# Patient Record
Sex: Male | Born: 1985 | Race: White | Hispanic: No | Smoking: Current some day smoker
Health system: Southern US, Community
[De-identification: ages and names within clinical notes are randomized; demographics above are authoritative.]

## PROBLEM LIST (undated history)

## (undated) DIAGNOSIS — F419 Anxiety disorder, unspecified: Secondary | ICD-10-CM

## (undated) HISTORY — PX: NO PAST SURGERIES: SHX2092

---

## 2020-03-26 ENCOUNTER — Ambulatory Visit (INDEPENDENT_AMBULATORY_CARE_PROVIDER_SITE_OTHER): Payer: 59 | Admitting: Professional

## 2020-03-26 ENCOUNTER — Other Ambulatory Visit: Payer: Self-pay

## 2020-03-26 DIAGNOSIS — F411 Generalized anxiety disorder: Secondary | ICD-10-CM | POA: Insufficient documentation

## 2020-03-26 DIAGNOSIS — F331 Major depressive disorder, recurrent, moderate: Secondary | ICD-10-CM | POA: Diagnosis not present

## 2020-03-26 DIAGNOSIS — F339 Major depressive disorder, recurrent, unspecified: Secondary | ICD-10-CM | POA: Insufficient documentation

## 2020-03-26 NOTE — Progress Notes (Signed)
Comprehensive Clinical Assessment (CCA) Note  03/26/2020 Marcus Sherman [id number]  Chief Complaint: depression, anxiety Visit Diagnosis: MDD, moderate, recurrent; GAD   CCA Screening, Triage and Referral (STR)  Patient Reported Information How did you hear about [place]? No data recorded Referral name: [facility]  Referral phone number: No data recorded  Whom do you see for routine medical problems? I don't have a doctor (Appointment in July)  Practice/Facility Name: No data recorded Practice/Facility Phone Number: No data recorded Name of Contact: No data recorded Contact Number: No data recorded Contact Fax Number: No data recorded Prescriber Name: No data recorded Prescriber Address (if known): No data recorded  What Is the Reason for Your Visit/Call Today? depression  How Long Has This Been Causing You Problems? > than 6 months  What Do You Feel Would Help You the Most Today? Therapy; Medication   Have You Recently Been in Any Inpatient Treatment (Hospital/Detox/Crisis Center/28-Day Program)? No  Name/Location of Program/Hospital:No data recorded How Long Were You There? No data recorded When Were You Discharged? No data recorded  Have You Ever Received Services From [facility] Before? No  Who Do You See at [facility]? No data recorded  Have You Recently Had Any Thoughts About Hurting Yourself? No  Are You Planning to Commit Suicide/Harm Yourself At This time? No   Have you Recently Had Thoughts About Hurting Someone Marcus Sherman? No  Explanation: No data recorded  Have You Used Any Alcohol or Drugs in the Past 24 Hours? Yes  How Long Ago Did You Use Drugs or Alcohol? 2100  What Did You Use and How Much? 5 beers last night; marijuana last night   Do You Currently Have a Therapist/Psychiatrist? No  Name of Therapist/Psychiatrist: No data recorded  Have You Been Recently Discharged From Any Office Practice or Programs? No  Explanation of Discharge From  Practice/Program: No data recorded    CCA Screening Triage Referral Assessment Type of Contact: No data recorded Is this Initial or Reassessment? No data recorded Date Telepsych consult ordered in [facility]:  No data recorded Time Telepsych consult ordered in [facility]:  No data recorded  Patient Reported Information Reviewed? No data recorded Patient Left Without Being Seen? No data recorded Reason for Not Completing Assessment: No data recorded  Collateral Involvement: No data recorded  Does Patient Have a Court Appointed Legal Guardian? No data recorded Name and Contact of Legal Guardian: No data recorded If Minor and Not Living with Parent(s), Who has Custody? No data recorded Is CPS involved or ever been involved? Never  Is APS involved or ever been involved? Never   Patient Determined To Be At Risk for Harm To Self or Others Based on Review of Patient Reported Information or Presenting Complaint? No data recorded Method: No data recorded Availability of Means: No data recorded Intent: No data recorded Notification Required: No data recorded Additional Information for Danger to Others Potential: No data recorded Additional Comments for Danger to Others Potential: No data recorded Are There Guns or Other Weapons in Your Home? No data recorded Types of Guns/Weapons: No data recorded Are These Weapons Safely Secured?                            No data recorded Who Could Verify You Are Able To Have These Secured: No data recorded Do You Have any Outstanding Charges, Pending Court Dates, Parole/Probation? No data recorded Contacted To Inform of Risk of Harm To  Self or Others: No data recorded  Location of Assessment: No data recorded  Does Patient Present under Involuntary Commitment? No  IVC Papers Initial File Date: No data recorded  [place] of Residence: [place]   Patient Currently Receiving the Following Services: No data recorded  Determination of Need: Routine (7  days)   Options For Referral: Medication Management; Outpatient Therapy     CCA Biopsychosocial Intake/Chief Complaint:  Pt reports having been dx with Bipolar in the past- denies any manic symptoms. Passive SI thoughts (OK if I was never born or wasn't here), denies HI/AVH. Grandfather committed suicide before I knew him. Pt reports issues with anxiety, depression, mood swings and hasn't had health insurance until recently so has not gotten help recently. Pt reports he has had meds in the past (over a decade ago) but that didn't work out well for him and he stopped taking (thinks Wellbutrin and a mood stabilizers). 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 Pt works 6 days a week. Pt works for a [employer] on weekends for BIL. Pt reports he wants to start dating again and wants to get his mental health stabilized first. Pt reports he doesn't want to do anything after work except "veg out." Pt got divorced and moved across country 5 years ago. Pt's mother passed away less than a year ago.  Current Symptoms/Problems: anxiety; depression; hard on self; blows up over little things; "hypernegative"   Patient Reported Schizophrenia/Schizoaffective Diagnosis in Past: No   Strengths: hard worker  Preferences: to feel better  Abilities: can attend and participate in treatment   Type of Services Patient Feels are Needed: medication management and therapy   Initial Clinical Notes/Concerns: No data recorded  Mental Health Symptoms Depression:  Fatigue; Irritability; Worthlessness; Hopelessness; Difficulty Concentrating   Duration of Depressive symptoms: Greater than two weeks   Mania:  None   Anxiety:   Fatigue; Irritability; Worrying; Difficulty concentrating   Psychosis:  None   Duration of Psychotic symptoms: No data  recorded  Trauma:  None   Obsessions:  None   Compulsions:  None   Inattention:  None   Hyperactivity/Impulsivity:  N/A   Oppositional/Defiant Behaviors:  None   Emotional Irregularity:  None   Other Mood/Personality Symptoms:  No data recorded   Mental Status Exam Appearance and self-care  Stature:  Average   Weight:  Average weight   Clothing:  Casual   Grooming:  Normal   Cosmetic use:  None   Posture/gait:  Normal   Motor activity:  Not Remarkable   Sensorium  Attention:  Normal   Concentration:  Normal   Orientation:  No data recorded  Recall/memory:  Normal   Affect and Mood  Affect:  Anxious; Depressed   Mood:  Anxious; Depressed   Relating  Eye contact:  Fleeting   Facial expression:  Anxious; Depressed   Attitude toward examiner:  Cooperative   Thought and Language  Speech flow: Normal   Thought content:  Appropriate to Mood and Circumstances   Preoccupation:  None   Hallucinations:  None   Organization:  No data recorded  [employer] of Knowledge:  Average   Intelligence:  Average   Abstraction:  Normal   Judgement:  Good   Reality Testing:  Adequate   Insight:  Flashes of insight   Decision Making:  Normal   Social Functioning  Social Maturity:  Responsible   Social Judgement:  Normal   Stress  Stressors:  Work; Illness; Relationship   Coping Ability:  Contractor Deficits:  None   Supports:  Family; Friends/Service system (see friends 1-2x a week; somewhat close with family but feels like he doesn't talk to them about everything)     Religion: Religion/Spirituality Are You A Religious Person?: No  Leisure/Recreation: Leisure / Recreation Do You Have Hobbies?: Yes Leisure and Hobbies: hiking  Exercise/Diet: Exercise/Diet Do You Exercise?: No Have You Gained or Lost A Significant Amount of Weight in the Past Six Months?: No Do You Follow a Special Diet?: No Do You Have Any Trouble  Sleeping?: No   CCA Employment/Education Employment/Work Situation: Employment / Work Situation Employment situation: Employed Where is patient currently employed?: $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 How long has patient been employed?: 1.5 year Patient's job has been impacted by current illness: No Has patient ever been in the [employer]?: No  Education: Education Is Patient Currently Attending School?: No Did Garment/textile technologist From [employer]?: Yes Did Theme park manager?: No Did Designer, television/film set?: No Did You Have An Individualized Education Program (IIEP): No Did You Have Any Difficulty At [employer]?: No Patient's Education Has Been Impacted by Current Illness: No   CCA Family/Childhood History Family and Relationship History: Family history Marital status: Divorced Divorced, when?: 5 years ago Are you sexually active?: No What is your sexual orientation?: bisexual- typically date women Does patient have children?: No  Childhood History:  Childhood History By whom was/is the patient raised?: Both parents Additional childhood history information: Divorced when 45; older siblings took care of [place] a lot because parents worked a lot; has a twin sister; youngest of 6; stepfather came into the picture when pt was 10ish Description of patient's relationship with caregiver when they were a child: OK Patient's description of current relationship with people who raised him/her: Mom passed less than a year ago; Dad- OK- trying more to reach out to him; good with stepfather How were you disciplined when you got in trouble as a child/adolescent?: grounding Does patient have siblings?: Yes Number of Siblings: 5 Description of patient's current relationship with siblings: twin sister; 4 older siblings- OK relationship with all Did patient suffer any verbal/emotional/physical/sexual abuse as a child?: No Did patient suffer from severe childhood neglect?: No Has patient ever been sexually  abused/assaulted/raped as an adolescent or adult?: No Was the patient ever a victim of a crime or a disaster?: No Witnessed domestic violence?: No Has patient been affected by domestic violence as an adult?: No  Child/Adolescent Assessment:     CCA Substance Use Alcohol/Drug Use: Alcohol / Drug Use Pain Medications: pt denies Prescriptions: pt denies Over the Counter: pt denies History of alcohol / drug use?: Yes Substance #1 Name of Substance 1: Alcohol 1 - Age of First Use: [age] 1 - Amount (size/oz): varied 1 - Frequency: varied 1 - Duration: off/on since 1 - Last Use / Amount: last night/ 5 beers 1 - Method of Aquiring: friends/self 1- Route of Use: oral Substance #2 Name of Substance 2: Marijuana 2 - Age of First Use: [age] 2 - Amount (size/oz): varied 2 - Frequency: pretty much daily for 15 years 2 - Duration: 15 years 2 - Last Use / Amount: last night 2 - Method of Aquiring: friend 2 - Route of Substance Use: smoke  ASAM's:  Six Dimensions of Multidimensional Assessment  Dimension 1:  Acute Intoxication and/or Withdrawal Potential:      Dimension 2:  Biomedical Conditions and Complications:      Dimension 3:  Emotional, Behavioral, or Cognitive Conditions and Complications:     Dimension 4:  Readiness to Change:     Dimension 5:  Relapse, Continued use, or Continued Problem Potential:     Dimension 6:  Recovery/Living Environment:     ASAM Severity Score:    ASAM Recommended Level of Treatment:     Substance use Disorder (SUD)    Recommendations for Services/Supports/Treatments: Recommendations for Services/Supports/Treatments Recommendations For Services/Supports/Treatments: Individual Therapy,Medication Management  DSM5 Diagnoses: Patient Active Problem List   Diagnosis Date Noted  . Major depressive disorder, recurrent episode, moderate (HCC) 03/26/2020  . Generalized anxiety disorder 03/26/2020    Patient Centered  Plan: Patient is on the following Treatment Plan(s):  Depression   Referrals to Alternative Service(s): Referred to Alternative Service(s):   Place:   Date:   Time:    Referred to Alternative Service(s):   Place:   Date:   Time:    Referred to Alternative Service(s):   Place:   Date:   Time:    Referred to Alternative Service(s):   Place:   Date:   Time:     Marcus Sherman, [facility]

## 2020-04-16 ENCOUNTER — Ambulatory Visit (INDEPENDENT_AMBULATORY_CARE_PROVIDER_SITE_OTHER): Payer: 59 | Admitting: Professional

## 2020-04-16 ENCOUNTER — Other Ambulatory Visit: Payer: Self-pay

## 2020-04-16 DIAGNOSIS — F411 Generalized anxiety disorder: Secondary | ICD-10-CM

## 2020-04-16 DIAGNOSIS — F331 Major depressive disorder, recurrent, moderate: Secondary | ICD-10-CM | POA: Diagnosis not present

## 2020-04-16 NOTE — Progress Notes (Addendum)
Virtual Visit via Telephone Note  I connected with Marcus Sherman on 04/16/20 at  9:00 AM EST by telephone and verified that I am speaking with the correct person using two identifiers.  Location: Patient: home Provider: Clinical Home Office   I discussed the limitations, risks, security and privacy concerns of performing an evaluation and management service by telephone and the availability of in person appointments. I also discussed with the patient that there may be a patient responsible charge related to this service. The patient expressed understanding and agreed to proceed.  Follow Up Instructions:    I discussed the assessment and treatment plan with the patient. The patient was provided an opportunity to ask questions and all were answered. The patient agreed with the plan and demonstrated an understanding of the instructions.   The patient was advised to call back or seek an in-person evaluation if the symptoms worsen or if the condition fails to improve as anticipated.  I provided 51 minutes of non-face-to-face time during this encounter.   Quinn Axe, [facility]    THERAPIST PROGRESS NOTE  Session Time: 9  Participation Level: Active  Behavioral Response: CasualAlertAnxious  Type of Therapy: Individual Therapy  Treatment Goals addressed: Anxietyanddepression  Interventions: CBT, DBT, Solution Focused, Strength-based, Supportive and Reframing  Summary: Marcus Sherman is a 35 y.o. male who presents with anxiety and depression symptoms.  Cln and pt completed via phone due to cln being able to see and hear pt in virtual room but pt unable to see/hear cln. Pt reports he has been getting outside more often which is helping his mental health. Pt reports having a regular routine helps him feel more healthy; pt reports he is trying to find structure. Pt reports "first big official sale at work which is a relief for me." Pt reports weekend job is "just fine." Pt reports it has  been a rough week due to the 1 year anniversary of Mom's passing. Pt reports he has been more emotional. Pt reports leaning on siblings and talking about Mom has been helpful. Pt reports he is using distractions, like going for a walk, instead of drinking. "I am trying to reprogram my brain instead of drinking." Cln and pt discuss ways to distract self and stay sober. Pt reports he is ready to work on himself and doesn't want to put it on the "back burner." Pt reports he values alone time and wants to put dating on hold. Cln and pt spend time discussing giving credit to self for hard work and insight. Cln and pt discuss writing stuff down to remind self of progress. Cln and pt discuss doing it on phone so it is password protected since pt is concerned about others reading. Pt reports he wrote things down in the past and was helpful and is willing to try again. Cln and pt discuss reframing thoughts. Cln and pt discuss "shoulds." Cln and pt spend time discussing treatment goals. Pt participates in creating treatment plan and agrees to goals. Pt denies SI/HI/AVH.   Suicidal/Homicidal: Nowithout intent/plan  Therapist Response: Cln asked how the client has been since CCA completed. Cln asked open ended questions about positive and/or negative changes that have occurred since CCA completed. Cln used CBT to discuss thoughts and thought stopping.  Cln and pt spent time discussing routine options and importance of structure. Cln and pt spent time creating treatment plan. Pt agrees to goals. Cln uses CBT to discuss cognitive distortions- "shoulds." Cln uses DBT to discuss distractions.  Cln assisted with scheduling next appointment.  Pt will reframe "should" statements when catches self. Pt will continue to use distractions to help manage desire to drink in depressive moments. Pt will write down specific distraction techniques he can use during depressive/anxious moments.   Plan: Return again in 2  weeks.  Diagnosis: Depression; Anxiety    Quinn Axe, [facility] 04/16/2020

## 2020-04-21 ENCOUNTER — Encounter (HOSPITAL_COMMUNITY): Payer: Self-pay

## 2020-04-21 ENCOUNTER — Telehealth (INDEPENDENT_AMBULATORY_CARE_PROVIDER_SITE_OTHER): Payer: 59 | Admitting: Psychiatry

## 2020-04-21 ENCOUNTER — Other Ambulatory Visit: Payer: Self-pay

## 2020-04-21 DIAGNOSIS — F331 Major depressive disorder, recurrent, moderate: Secondary | ICD-10-CM | POA: Diagnosis not present

## 2020-04-21 MED ORDER — ESCITALOPRAM OXALATE 10 MG PO TABS: 10.0000 mg | ORAL_TABLET | Freq: Every day | ORAL | 1 refills | Status: DC

## 2020-04-21 NOTE — Progress Notes (Signed)
Psychiatric Initial Adult Assessment   Patient Identification: Marcus Sherman MRN:  [id number] Date of Evaluation:  04/21/2020 Referral Source: self Chief Complaint:   Chief Complaint    Anxiety; Establish Care; Depression     Visit Diagnosis: Major depressive disorder, moderate  History of Present Illness:   35 yo male presents for evaluation of depression and anxiety.  Reports a current 6 out of 10 depression with 10 being the highest and no suicidal ideations or past attempts.  His anxiety is moderate to high with no panic attacks.  He was diagnosed years ago with a mood disorder and was started on Wellbutrin and a mood stabilizer which he did not feel was a good combination for him and also had side effects.  No hallucinations, trauma, mania symptoms, or homicidal ideations.  His alcohol consumption fluctuates based on stressors and currently drinks approximately 4 nights a week but not to excess.  When he is not drinking he does not have side effects and was not drinking until a year ago when his mother died.  He does feel like he has process this grief and is working on moving forward.  Issues with concentration at time and was told as a child that he was "hyperactive".  Discussed being tested for ADHD as he feels that is relevant to him and resources for testing provided.  Sleep is currently "good" and appetite is steady with no weight loss or gain.  He enjoys hiking and camping along with other things being outside.  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  Currently works 6 days a week.  Family history of depression and anxiety among siblings and his parents, maternal grandfather with suicide completion.  Discussed medication options and decided to go with Lexapro for depression and anxiety.  He is agreeable to also try therapy to assist with his skills to combat depression and anxiety.  Engages easily in conversation with  ability to express himself.  Associated Signs/Symptoms: Depression Symptoms:  depressed mood, anxiety, (Hypo) Manic Symptoms:  none Anxiety Symptoms:  Excessive Worry, Psychotic Symptoms:  none PTSD Symptoms: NA  Past Psychiatric History: depression  Previous Psychotropic Medications: Yes   Substance Abuse History in the last 12 months:  No.  Consequences of Substance Abuse: NA  Past Medical History: History reviewed. No pertinent past medical history. History reviewed. No pertinent surgical history.  Family Psychiatric History: grandfather with depression and suicide completion.  6 in family with depression--5 siblings and parents.  Family History: History reviewed. No pertinent family history.  Social History:   Social History   Socioeconomic History  . Marital status: Divorced    Spouse name: Not on file  . Number of children: Not on file  . Years of education: Not on file  . Highest education level: Not on file  Occupational History  . Not on file  Tobacco Use  . Smoking status: Not on file  . Smokeless tobacco: Not on file  Substance and Sexual Activity  . Alcohol use: Not on file  . Drug use: Not on file  . Sexual activity: Not on file  Other Topics Concern  . Not on file  Social History Narrative  . Not on file   Social Determinants of Health   Financial Resource Strain: Not on file  Food Insecurity: Not on file  Transportation Needs: Not on file  Physical Activity: Not on file  Stress: Not on file  Social Connections: Not on file    Additional Social History: Lives alone and starting a wood working business, works 6 days  Allergies:  NKDA  Metabolic Disorder Labs: No results found for: HGBA1C, MPG No results found for: PROLACTIN No results found for: CHOL, TRIG, HDL, CHOLHDL, VLDL, LDLCALC No results found for: TSH  Therapeutic Level Labs: No results found for: LITHIUM No results found for: CBMZ No results found for: VALPROATE  Current  Medications: No current outpatient medications on file.   No current facility-administered medications for this visit.    Musculoskeletal: Strength & Muscle Tone: within normal limits Gait & Station: normal Patient leans: N/A  Psychiatric Specialty Exam: Review of Systems  Psychiatric/Behavioral: Positive for dysphoric mood. The patient is nervous/anxious.   All other systems reviewed and are negative.   There were no vitals taken for this visit.There is no height or weight on file to calculate BMI.  General Appearance: Casual  Eye Contact:  Good  Speech:  Normal Rate  Volume:  Normal  Mood:  Anxious and Depressed  Affect:  Congruent  Thought Process:  Coherent and Descriptions of Associations: Intact  Orientation:  Full (Time, Place, and Person)  Thought Content:  WDL and Logical  Suicidal Thoughts:  No  Homicidal Thoughts:  No  Memory:  Immediate;   Good Recent;   Good Remote;   Good  Judgement:  Good  Insight:  Good  Psychomotor Activity:  Normal  Concentration:  Concentration: Good and Attention Span: Good  Recall:  Good  Fund of Knowledge:Good  Language: Good  Akathisia:  No  Handed:  Right  AIMS (if indicated):  not done  Assets:  Housing Leisure Time Physical Health Resilience Social Support Vocational/Educational  ADL's:  Intact  Cognition: WNL  Sleep:  Good   Screenings: PHQ2-9   Flowsheet Row Video Visit from 04/21/2020 in [facility] Counselor from 03/26/2020 in [facility]  PHQ-2 Total Score 1 2  PHQ-9 Total Score 2 10    Flowsheet Row Video Visit from 04/21/2020 in [facility] Counselor from 03/26/2020 in [facility]  C-SSRS RISK CATEGORY No Risk No Risk      Assessment and Plan:  Major depressive disorder, recurrent, moderate: -Continue Lexapro 5 mg daily for one week then increase to 10 mg daily -Encouraged therapy -Follow up in  one month  Rule out ADHD: -Psychological testing resources provided for diagnosis.  Virtual Visit via Video Note  I connected with Davyn Morandi on 04/21/20 at  2:30 PM EST by a video enabled telemedicine application and verified that I am speaking with the correct person using two identifiers.  Location: Patient: home Provider: home   I discussed the limitations of evaluation and management by telemedicine and the availability of in person appointments. The patient expressed understanding and agreed to proceed.  Follow Up Instructions: Follow up in one month   I discussed the assessment and treatment plan with the patient. The patient was provided an opportunity to ask questions and all were answered. The patient agreed with the plan and demonstrated an understanding of the instructions.   The patient was advised to call back or seek an in-person evaluation if the symptoms worsen or if the condition fails to improve as anticipated.  I provided 30 minutes of non-face-to-face time during this encounter.   Nanine Means, NP    Nanine Means, NP 3/7/20222:46 PM

## 2020-05-02 ENCOUNTER — Ambulatory Visit (INDEPENDENT_AMBULATORY_CARE_PROVIDER_SITE_OTHER): Payer: 59 | Admitting: Professional

## 2020-05-02 ENCOUNTER — Other Ambulatory Visit: Payer: Self-pay

## 2020-05-02 DIAGNOSIS — F331 Major depressive disorder, recurrent, moderate: Secondary | ICD-10-CM | POA: Diagnosis not present

## 2020-05-02 DIAGNOSIS — F411 Generalized anxiety disorder: Secondary | ICD-10-CM | POA: Diagnosis not present

## 2020-05-02 NOTE — Progress Notes (Signed)
Virtual Visit via Telephone Note  I connected with Johndavid Geralds on 05/02/20 at  9:00 AM EDT by telephone and verified that I am speaking with the correct person using two identifiers.  Location: Patient: home Provider: Clinical Home Office   I discussed the limitations, risks, security and privacy concerns of performing an evaluation and management service by telephone and the availability of in person appointments. I also discussed with the patient that there may be a patient responsible charge related to this service. The patient expressed understanding and agreed to proceed.  Follow Up Instructions:    I discussed the assessment and treatment plan with the patient. The patient was provided an opportunity to ask questions and all were answered. The patient agreed with the plan and demonstrated an understanding of the instructions.   The patient was advised to call back or seek an in-person evaluation if the symptoms worsen or if the condition fails to improve as anticipated.  I provided 55 minutes of non-face-to-face time during this encounter.   Quinn Axe, [facility]    THERAPIST PROGRESS NOTE  Session Time: 9  Participation Level: Active  Behavioral Response: CasualAlertAnxious  Type of Therapy: Individual Therapy  Treatment Goals addressed: Anxietyanddepression  Interventions: CBT, DBT, Solution Focused, Strength-based, Supportive and Reframing  Summary: Hoang Pettingill is a 35 y.o. male who presents with anxiety and depression symptoms.  Pt reports "things have been OK." Pt reports he has been trouble shooting more than producing this week at work. Pt reports less intrusive thoughts. Pt reports increased ability to catch "should" statements and counter them. Pt reports "I haven't been hitting the frustration wall as much." Pt reports he has started Lexapro almost 2 weeks ago- has had some side effects including tingling in body- but has gone away. Pt reports he will be  tested for ADHD soon; waiting on referral. Pt is ready to move forward with more skills: sleep hygiene. Pt reports "I hit a 'wall' at 5 some nights." Cln and pt spend time discussing bedtime routine; guided meditation, sleep diary, actual steps for routine (shower, meditate, brush teeth, write in journal). Pt reports drinking less and trying to be more mindful about choices.  Cln and pt discuss eating lunch outside and incorporating "must do" activities with outside time to reset. Pt denies SI/HI/AVH.   Suicidal/Homicidal: Nowithout intent/plan  Therapist Response: Cln asked how the client has been since CCA completed. Cln asked open ended questions about positive and/or negative changes that have occurred since CCA completed. Cln used CBT to discuss thoughts and thought stopping.  Cln and pt spent time discussing routine options and importance of structure. Cln used CBT to discuss cognitive distortions- "shoulds"- and reframing. Cln used DBT to discuss distractions. Cln assisted with scheduling next appointment.  Pt will continue to reframe "should" statements when catches self. Pt will continue to use distractions to help manage desire to drink in depressive moments. Pt will write down specific distraction techniques he can use during depressive/anxious moments. Pt will write in a sleep journal and create a sleep routine.   Plan: Return again in 2 weeks.  Diagnosis: Depression; Anxiety    Quinn Axe, [facility] 05/02/2020

## 2020-05-16 ENCOUNTER — Ambulatory Visit (INDEPENDENT_AMBULATORY_CARE_PROVIDER_SITE_OTHER): Payer: 59 | Admitting: Professional

## 2020-05-16 ENCOUNTER — Other Ambulatory Visit: Payer: Self-pay

## 2020-05-16 DIAGNOSIS — F331 Major depressive disorder, recurrent, moderate: Secondary | ICD-10-CM | POA: Diagnosis not present

## 2020-05-16 DIAGNOSIS — F411 Generalized anxiety disorder: Secondary | ICD-10-CM | POA: Diagnosis not present

## 2020-05-16 NOTE — Progress Notes (Signed)
Virtual Visit via Telephone Note  I connected with Marcus Sherman on 05/16/20 at  9:00 AM EDT by telephone and verified that I am speaking with the correct person using two identifiers.  Location: Patient: home Provider: Clinical Home Office   I discussed the limitations, risks, security and privacy concerns of performing an evaluation and management service by telephone and the availability of in person appointments. I also discussed with the patient that there may be a patient responsible charge related to this service. The patient expressed understanding and agreed to proceed.  Follow Up Instructions:    I discussed the assessment and treatment plan with the patient. The patient was provided an opportunity to ask questions and all were answered. The patient agreed with the plan and demonstrated an understanding of the instructions.   The patient was advised to call back or seek an in-person evaluation if the symptoms worsen or if the condition fails to improve as anticipated.  I provided 33 minutes of non-face-to-face time during this encounter.   Quinn Axe, [facility]    THERAPIST PROGRESS NOTE  Session Time: 9  Participation Level: Active  Behavioral Response: CasualAlertAnxious  Type of Therapy: Individual Therapy  Treatment Goals addressed: Anxietyanddepression  Interventions: CBT, Solution Focused, Strength-based, Supportive and Reframing  Summary: Marcus Sherman is a 35 y.o. male who presents with anxiety and depression symptoms. Pt reports "things have been going well overall." Pt reports he has been busy; sister came to visit. Pt reports he continues to work on "should statements." Pt reports less intrusive thoughts and feels like the imposter syndrome he was experiencing is decreasing. 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 Pt reports he did not complete sleep journal but being more conscious of what he is doing around bedtime.  Pt reports getting more restful sleep. Cln and pt spend time discussing assertive communication and I statements. Cln and pt spend time discussing boundaries, specifically time. Pt has to stop appointment early due to work. Pt denies SI/HI/AVH.    Suicidal/Homicidal: Nowithout intent/plan  Therapist Response: Cln asked how the client has been since last seen. Cln asked open ended questions about positive and/or negative changes that have occurred since last seen. Cln used active listening to understand and validate patient. Cln used CBT to discuss cognitive distortions and reframing.   Cln assisted with scheduling next appointment.  Pt will continue to reframe "should" statements when catches self. Pt will use boundaries. Pt will try using I-Statements.   Plan: Return again in 2 weeks.  Diagnosis: Depression; Anxiety    Quinn Axe, [facility] 05/16/2020

## 2020-05-30 ENCOUNTER — Ambulatory Visit (INDEPENDENT_AMBULATORY_CARE_PROVIDER_SITE_OTHER): Payer: 59 | Admitting: Professional

## 2020-05-30 ENCOUNTER — Other Ambulatory Visit: Payer: Self-pay

## 2020-05-30 DIAGNOSIS — F411 Generalized anxiety disorder: Secondary | ICD-10-CM

## 2020-05-30 DIAGNOSIS — F330 Major depressive disorder, recurrent, mild: Secondary | ICD-10-CM

## 2020-05-30 NOTE — Progress Notes (Signed)
Virtual Visit via Telephone Note  I connected with Marcus Sherman on 05/30/20 at  9:00 AM EDT by telephone and verified that I am speaking with the correct person using two identifiers.  Location: Patient: home Provider: Clinical Home Office   I discussed the limitations, risks, security and privacy concerns of performing an evaluation and management service by telephone and the availability of in person appointments. I also discussed with the patient that there may be a patient responsible charge related to this service. The patient expressed understanding and agreed to proceed.  Follow Up Instructions:    I discussed the assessment and treatment plan with the patient. The patient was provided an opportunity to ask questions and all were answered. The patient agreed with the plan and demonstrated an understanding of the instructions.   The patient was advised to call back or seek an in-person evaluation if the symptoms worsen or if the condition fails to improve as anticipated.  I provided 45 minutes of non-face-to-face time during this encounter.   Quinn Axe, [facility]    THERAPIST PROGRESS NOTE  Session Time: 9  Participation Level: Active  Behavioral Response: CasualAlertAnxious  Type of Therapy: Individual Therapy  Treatment Goals addressed: Anxietyanddepression  Interventions: CBT, Solution Focused, Strength-based, Supportive and Reframing  Summary: Marcus Sherman is a 35 y.o. male who presents with anxiety and depression symptoms. Pt reports things have been "pretty good." Pt reports he has been busy. Pt reports he went camping with friends this week and "it was great to unplug." Pt reports work has been busy. Cln and pt spend time discussing reframing thoughts around annoyance around some customers. Pt reports mood "has been solid. I still have some days when I can't focus but I haven't been depressed. My friends seem to notice." Pt reports he is going to do 30 days  sober before birthday at the end of May. Cln and pt spend time discussing reasoning behind this. Pt denies SI/HI/AVH   Suicidal/Homicidal: Nowithout intent/plan  Therapist Response: Cln asked how the client has been since last seen. Cln asked open ended questions about positive and/or negative changes that have occurred since last seen. Cln used active listening to understand and validate patient. Cln used CBT to discuss cognitive distortions and reframing.   Cln assisted with scheduling next appointment.  Pt will continue to reframe "should" statements when catches self. Pt will continue to use boundaries. Pt will continue to use I-Statements.   Plan: Return again in 2 weeks.  Diagnosis: Depression; Anxiety    Quinn Axe, [facility] 05/30/2020

## 2020-06-13 ENCOUNTER — Ambulatory Visit (INDEPENDENT_AMBULATORY_CARE_PROVIDER_SITE_OTHER): Payer: 59 | Admitting: Professional

## 2020-06-13 ENCOUNTER — Ambulatory Visit (HOSPITAL_COMMUNITY): Payer: 59 | Admitting: Professional

## 2020-06-13 ENCOUNTER — Other Ambulatory Visit: Payer: Self-pay

## 2020-06-13 ENCOUNTER — Telehealth (HOSPITAL_COMMUNITY): Payer: Self-pay | Admitting: Professional

## 2020-06-13 DIAGNOSIS — F330 Major depressive disorder, recurrent, mild: Secondary | ICD-10-CM

## 2020-06-13 DIAGNOSIS — F411 Generalized anxiety disorder: Secondary | ICD-10-CM

## 2020-06-13 NOTE — Telephone Encounter (Signed)
See call log 

## 2020-06-13 NOTE — Progress Notes (Signed)
Virtual Visit via Telephone Note  I connected with Torrance Stockley on 06/13/20 at  9:00 AM EDT by telephone and verified that I am speaking with the correct person using two identifiers.  Location: Patient: home Provider: Clinical Home Office   I discussed the limitations, risks, security and privacy concerns of performing an evaluation and management service by telephone and the availability of in person appointments. I also discussed with the patient that there may be a patient responsible charge related to this service. The patient expressed understanding and agreed to proceed.  Follow Up Instructions:    I discussed the assessment and treatment plan with the patient. The patient was provided an opportunity to ask questions and all were answered. The patient agreed with the plan and demonstrated an understanding of the instructions.   The patient was advised to call back or seek an in-person evaluation if the symptoms worsen or if the condition fails to improve as anticipated.  I provided 45 minutes of non-face-to-face time during this encounter.   Quinn Axe, [facility]    THERAPIST PROGRESS NOTE  Session Time: 9  Participation Level: Active  Behavioral Response: CasualAlertAnxious  Type of Therapy: Individual Therapy  Treatment Goals addressed: Anxietyanddepression  Interventions: CBT, Solution Focused, Strength-based, Supportive and Reframing  Summary: Ashwath Lasch is a 35 y.o. male who presents with anxiety and depression symptoms. Pt reports he feels good. Pt reports he was supposed to have a medication appointment but it was never scheduled. This cln will help get that appointment scheduled for pt. Pt reports he has been working a lot but "it's been good." Cln and pt spend time discussing listening to body. Cln and pt spend time discussing reframing "perfectionism thoughts" and how to take constructive criticism. Cln and pt spend time discussing how pt is doing well  with reframing thoughts and not grabbing on to negative thoughts. Pt and cln discuss COVID and how it has effected life on a daily basis. Pt is scheduled with Nanine Means for medication management on 5/2 @ 930. Pt still feels good about progress made and continues to feel good about having a break for therapy. Pt denies SI/HI/AVH.    Suicidal/Homicidal: Nowithout intent/plan  Therapist Response: Cln asked how the client has been since last seen. Cln asked open ended questions about positive and/or negative changes that have occurred since last seen. Cln used active listening to understand and validate patient. Cln used CBT to discuss cognitive distortions and reframing.   Cln assisted with scheduling next appointment.  Pt will continue to reframe "should" statements when catches self. Pt will continue to use boundaries. Pt will continue to use I-Statements.   Plan: Pt reports he wants to take a break from counseling while this cln is out on leave. This cln agrees.  Diagnosis: Depression; Anxiety    Quinn Axe, [facility] 06/13/2020

## 2020-06-16 ENCOUNTER — Encounter (HOSPITAL_COMMUNITY): Payer: Self-pay

## 2020-06-16 ENCOUNTER — Other Ambulatory Visit: Payer: Self-pay

## 2020-06-16 ENCOUNTER — Telehealth (INDEPENDENT_AMBULATORY_CARE_PROVIDER_SITE_OTHER): Payer: 59 | Admitting: Psychiatry

## 2020-06-16 DIAGNOSIS — F330 Major depressive disorder, recurrent, mild: Secondary | ICD-10-CM

## 2020-06-16 DIAGNOSIS — F411 Generalized anxiety disorder: Secondary | ICD-10-CM | POA: Diagnosis not present

## 2020-06-16 MED ORDER — ESCITALOPRAM OXALATE 10 MG PO TABS: 10.0000 mg | ORAL_TABLET | Freq: Every day | ORAL | 4 refills | Status: DC

## 2020-06-16 NOTE — Progress Notes (Signed)
Psychiatric Initial Adult Assessment   Patient Identification: Marcus Sherman MRN:  [id number] Date of Evaluation:  06/16/2020   Referral Source: self Chief Complaint:    Visit Diagnosis: Major depressive disorder, moderate  History of Present Illness:   35 yo male presents for follow up for depression and anxiety, Lexapro started and increased to 10 mg daily. "Everything seems to be working and more manageable", mood is pretty good.  Initially, the Lexapro effected his libido and has subsided now, slight effect.  This is the "best I have felt since I can remember".  He reports his depression is "very low, fleeting", 1-2/10 depression, "things don't bother me as much", no suicidal ideations or past attempts.  His anxiety is low, feels he still over thinking but does not affect his life, no panic attacks.  Jaw clenching issues for a 1 1/5 years related to stress, working on relaxation. No hallucinations, trauma, mania symptoms, or homicidal ideations.  His alcohol consumption has lessened and not drinking to excess, more supportive friends who are not drinking.  He did process some of his grief with his mother, feels he has grieved enough at this time.  It has provided a new perspective. Discussed being tested again for ADHD as he feels that is relevant to him and he has resources.  Sleep is "pretty good" and tired from working six days a week.  He is working on his sleep hygiene.  His appetite is steady with no weight loss or gain.  Will continue his Lexapro and follow up in 3 months.  Associated Signs/Symptoms: Depression Symptoms:  depressed mood, anxiety, (Hypo) Manic Symptoms:  none Anxiety Symptoms:  Excessive Worry, Psychotic Symptoms:  none PTSD Symptoms: NA  Past Psychiatric History: depression  Previous Psychotropic Medications: Yes   Substance Abuse History in the last 12 months:  No.  Consequences of Substance Abuse: NA  Past Medical History: No past medical history on file. No  past surgical history on file.  Family Psychiatric History: grandfather with depression and suicide completion.  6 in family with depression--5 siblings and parents.  Family History: No family history on file.  Social History:   Social History   Socioeconomic History  . Marital status: Divorced    Spouse name: Not on file  . Number of children: Not on file  . Years of education: Not on file  . Highest education level: Not on file  Occupational History  . Not on file  Tobacco Use  . Smoking status: Current Every Day Smoker    Types: Cigarettes  . Smokeless tobacco: Never Used  . Tobacco comment: Smokes a few times a week, ususally with friends, planning on stopping, resources provided  Substance and Sexual Activity  . Alcohol use: Yes    Alcohol/week: 2.0 - 3.0 standard drinks    Types: 2 - 3 Cans of beer per week  . Drug use: Not on file  . Sexual activity: Not on file  Other Topics Concern  . Not on file  Social History Narrative  . Not on file   Social Determinants of Health   Financial Resource Strain: Not on file  Food Insecurity: Not on file  Transportation Needs: Not on file  Physical Activity: Not on file  Stress: Not on file  Social Connections: Not on file    Additional Social History: Lives alone and starting a wood working business, works 6 days  Allergies:  NKDA  Metabolic Disorder Labs: No results found for: HGBA1C, MPG No results  found for: PROLACTIN No results found for: CHOL, TRIG, HDL, CHOLHDL, VLDL, LDLCALC No results found for: TSH  Therapeutic Level Labs: No results found for: LITHIUM No results found for: CBMZ No results found for: VALPROATE  Current Medications: Current Outpatient Medications  Medication Sig Dispense Refill  . escitalopram (LEXAPRO) 10 MG tablet Take 1 tablet (10 mg total) by mouth daily. Take half tablet daily for one week then take a whole tablet daily 30 tablet 1   No current facility-administered medications for  this visit.    Musculoskeletal: Strength & Muscle Tone: within normal limits Gait & Station: normal Patient leans: N/A  Psychiatric Specialty Exam: Review of Systems  Psychiatric/Behavioral: Positive for dysphoric mood. The patient is nervous/anxious.   All other systems reviewed and are negative.   There were no vitals taken for this visit.There is no height or weight on file to calculate BMI.  General Appearance: Casual  Eye Contact:  Good  Speech:  Normal Rate  Volume:  Normal  Mood:  Anxious and Depressed  Affect:  Congruent  Thought Process:  Coherent and Descriptions of Associations: Intact  Orientation:  Full (Time, Place, and Person)  Thought Content:  WDL and Logical  Suicidal Thoughts:  No  Homicidal Thoughts:  No  Memory:  Immediate;   Good Recent;   Good Remote;   Good  Judgement:  Good  Insight:  Good  Psychomotor Activity:  Normal  Concentration:  Concentration: Good and Attention Span: Good  Recall:  Good  Fund of Knowledge:Good  Language: Good  Akathisia:  No  Handed:  Right  AIMS (if indicated):  not done  Assets:  Housing Leisure Time Physical Health Resilience Social Support Vocational/Educational  ADL's:  Intact  Cognition: WNL  Sleep:  Good   Screenings: PHQ2-9   Flowsheet Row Video Visit from 04/21/2020 in [facility] from 03/26/2020 in [facility]  PHQ-2 Total Score 1 2  PHQ-9 Total Score 2 10    Flowsheet Row Video Visit from 04/21/2020 in [facility] Counselor from 03/26/2020 in [facility]  C-SSRS RISK CATEGORY No Risk No Risk      Assessment and Plan:  Major depressive disorder, recurrent, moderate: -Continue Lexapro 10 mg daily -Encouraged therapy -Follow up in one month  Rule out ADHD: -Psychological testing resources provided for diagnosis.  Virtual Visit via Video Note  I connected with Marcus Sherman  on 06/16/20 at  9:30 AM EDT by a video enabled telemedicine application and verified that I am speaking with the correct person using two identifiers.  Location: Patient: home Provider: home office   I discussed the limitations of evaluation and management by telemedicine and the availability of in person appointments. The patient expressed understanding and agreed to proceed.  Follow Up Instructions: Follow up in 3 months   I discussed the assessment and treatment plan with the patient. The patient was provided an opportunity to ask questions and all were answered. The patient agreed with the plan and demonstrated an understanding of the instructions.   The patient was advised to call back or seek an in-person evaluation if the symptoms worsen or if the condition fails to improve as anticipated.  I provided 30 minutes of non-face-to-face time during this encounter.   Nanine Means, NP   Nanine Means, NP 5/2/20229:22 AM

## 2020-08-27 ENCOUNTER — Other Ambulatory Visit: Payer: Self-pay | Admitting: Urology

## 2020-08-27 DIAGNOSIS — Q550 Absence and aplasia of testis: Secondary | ICD-10-CM

## 2020-09-16 ENCOUNTER — Ambulatory Visit
Admission: RE | Admit: 2020-09-16 | Discharge: 2020-09-16 | Disposition: A | Discharge status: Home / Self Care | Payer: 59 | Source: Ambulatory Visit | Attending: Urology | Admitting: Urology

## 2020-09-16 DIAGNOSIS — Q550 Absence and aplasia of testis: Secondary | ICD-10-CM

## 2020-09-29 ENCOUNTER — Telehealth: Payer: 59 | Admitting: Nurse Practitioner

## 2020-09-29 ENCOUNTER — Encounter: Payer: Self-pay | Admitting: Nurse Practitioner

## 2020-09-29 DIAGNOSIS — R1032 Left lower quadrant pain: Secondary | ICD-10-CM

## 2020-09-29 DIAGNOSIS — R1031 Right lower quadrant pain: Secondary | ICD-10-CM

## 2020-09-29 NOTE — Progress Notes (Signed)
Virtual Visit Consent   Marcus Sherman, you are scheduled for a virtual visit with a [facility] provider today.     Just as with appointments in the office, your consent must be obtained to participate.  Your consent will be active for this visit and any virtual visit you may have with one of our providers in the next 365 days.     If you have a MyChart account, a copy of this consent can be sent to you electronically.  All virtual visits are billed to your insurance company just like a traditional visit in the office.    As this is a virtual visit, video technology does not allow for your provider to perform a traditional examination.  This may limit your provider's ability to fully assess your condition.  If your provider identifies any concerns that need to be evaluated in person or the need to arrange testing (such as labs, EKG, etc.), we will make arrangements to do so.     Although advances in technology are sophisticated, we cannot ensure that it will always work on either your end or our end.  If the connection with a video visit is poor, the visit may have to be switched to a telephone visit.  With either a video or telephone visit, we are not always able to ensure that we have a secure connection.     I need to obtain your verbal consent now.   Are you willing to proceed with your visit today?    Marcus Sherman has provided verbal consent on 09/29/2020 for a virtual visit (video or telephone).   Marcus Simas, FNP   Date: 09/29/2020 2:07 PM   Virtual Visit via Video Note   I, Marcus Sherman, connected with  Marcus Sherman  ([id number], 1986/02/11) on 09/29/20 at  2:15 PM EDT by a video-enabled telemedicine application and verified that I am speaking with the correct person using two identifiers.  Location: Patient: Virtual Visit Location Patient: Home Provider: Virtual Visit Location Provider: Office/Clinic   I discussed the limitations of evaluation and management by telemedicine and  the availability of in person appointments. The patient expressed understanding and agreed to proceed.    History of Present Illness: Marcus Sherman is a 35 y.o. who identifies as a male who was assigned male at birth, and is being seen with complaints of groin pain for the past 3 weeks. The pain is worse in the morning. Also has numbness on the outside of left leg. The pain intensifies when he sits. Denies any difficulty going to the bathroom.  He believes he may have injured himself by working out "wrong" after not exercising for awhile.  He has been walking/working. Denies any weakness or numbness below his knees. Denies any difficulty going to the bathroom.   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 Take half tablet daily for one week then take a whole tablet daily, Disp: 30 tablet, Rfl: 4  Observations/Objective: Patient  is well-developed, well-nourished in no acute distress.  Resting comfortably at home.  Head is normocephalic, atraumatic.  No labored breathing.  Speech is clear and coherent with logical content.  Patient is alert and oriented at baseline.    Assessment and Plan: 1. Bilateral groin pain Now with numbness, recommended seeing [facility] today for evaluation given length of symptoms.     Patient agreeable to plan   Follow Up Instructions: I discussed the assessment and treatment plan with the patient. The patient was provided an opportunity to ask questions and all were  answered. The patient agreed with the plan and demonstrated an understanding of the instructions.  A copy of instructions were sent to the patient via MyChart.  The patient was advised to call back or seek an in-person evaluation if the symptoms worsen or if the condition fails to improve as anticipated.  Time:  I spent 10 minutes with the patient via telehealth technology discussing the above problems/concerns.    Marcus Simas, FNP

## 2020-10-01 ENCOUNTER — Ambulatory Visit (INDEPENDENT_AMBULATORY_CARE_PROVIDER_SITE_OTHER): Payer: 59 | Admitting: Orthopaedic Surgery

## 2020-10-01 ENCOUNTER — Ambulatory Visit (INDEPENDENT_AMBULATORY_CARE_PROVIDER_SITE_OTHER): Payer: 59

## 2020-10-01 ENCOUNTER — Other Ambulatory Visit: Payer: Self-pay

## 2020-10-01 ENCOUNTER — Encounter: Payer: Self-pay | Admitting: Orthopaedic Surgery

## 2020-10-01 DIAGNOSIS — R1031 Right lower quadrant pain: Secondary | ICD-10-CM | POA: Diagnosis not present

## 2020-10-01 DIAGNOSIS — R1032 Left lower quadrant pain: Secondary | ICD-10-CM

## 2020-10-01 DIAGNOSIS — M545 Low back pain, unspecified: Secondary | ICD-10-CM | POA: Diagnosis not present

## 2020-10-01 DIAGNOSIS — G8929 Other chronic pain: Secondary | ICD-10-CM

## 2020-10-01 NOTE — Progress Notes (Signed)
Office Visit Note   Patient: Marcus Sherman           Date of Birth: 1985/04/21           MRN: [id number] Visit Date: 10/01/2020              Requested by: No referring provider defined for this encounter. PCP: Patient, No Pcp Per (Inactive)   Assessment & Plan: Visit Diagnoses:  1. Bilateral groin pain   2. Low back pain, unspecified back pain laterality, unspecified chronicity, unspecified whether sciatica present   3. Groin pain, chronic, right     Plan: Marcus Sherman experienced insidious onset of right groin pain approximately 3 weeks ago.  In addition he has had a little discomfort in his left thigh and some occasional numbness along the lateral aspect of the proximal left thigh.  He has had a history of some chronic off-and-on back pain.  He is not experience any numbness or tingling beyond either thigh.  He thinks he may have "pulled something while working but does not remember specific injury or trauma.  His workday requires him to be both standing and sitting.  He is tried some ibuprofen that seems to make a difference.  He recently had a video visit with his primary care physician who suggested an orthopedic evaluation.  Films of his right hip and left hip were negative for any obvious abnormalities.  He does have some mild degenerative changes lumbar spine with lumbarization of the first sacral vertebrae.  His hip exam was benign without loss of motion or pain with movement.  I suspect that the pain is experiencing might originate from his back.  Neurologically he is intact and is not in persistent discomfort.  Have suggested course of exercises which he can download on his computer.  Would like to reevaluate in a month.  Consider MRI scan of either back or pelvis if he has persistent discomfort or should there be a change.  Could have a labral tear on the right in which case it would be an MRI arthrogram  Follow-Up Instructions: Return in about 1 month (around 11/01/2020).    Orders:  Orders Placed This Encounter  Procedures   XR Pelvis 1-2 Views   XR Lumbar Spine 2-3 Views   No orders of the defined types were placed in this encounter.     Procedures: No procedures performed   Clinical Data: No additional findings.   Subjective: Chief Complaint  Patient presents with   Pelvis - Pain  Insidious onset of right groin pain and some discomfort along the lateral aspect of his left thigh over the last 3 to 4 weeks.  No history of injury or trauma.  He is required to sit and stand at work.  No discomfort beyond the mid thighs distally.  Has had some chronic off-and-on back pain.  Denies any bowel or bladder changes.  Presently having more discomfort in the right groin that is occasional.  HPI  Review of Systems   Objective: Vital Signs: There were no vitals taken for this visit.  Physical Exam Constitutional:      Appearance: He is well-developed.  Eyes:     Pupils: Pupils are equal, round, and reactive to light.  Pulmonary:     Effort: Pulmonary effort is normal.  Skin:    General: Skin is warm and dry.  Neurological:     Mental Status: He is alert and oriented to person, place, and time.  Psychiatric:  Behavior: Behavior normal.    Ortho Exam awake alert and oriented x3.  Comfortable sitting.  No pain with range of motion of either hip.  There may been very minimal decrease external rotation of the right hip compared to the left but no significant pain.  No pain with flexion extension or popping or clicking.  No pain with hyperflexion of his right hip.  No loss of sensation along either of lower extremity.  Reflexes are symmetrical.  Motor and sensory exam intact.  No percussible tenderness of the lumbar spine.  No pain over either greater trochanter.  Specialty Comments:  No specialty comments available.  Imaging: XR Lumbar Spine 2-3 Views  Result Date: 10/01/2020 Films of the lumbar spine obtained 2 projections.  On the  lateral film there is evidence of lumbarization of the first sacral vertebrae.  No listhesis.  Some mild facet sclerosis at L5-S1.  On the AP view there is very minimal left-sided curvature of less than 5 degrees.  No acute changes  XR Pelvis 1-2 Views  Result Date: 10/01/2020 AP the pelvis did not demonstrate any obvious hip pathology.  SI joints intact.  Joint spaces well-maintained.  No abnormality about the greater trochanter    PMFS History: Patient Active Problem List   Diagnosis Date Noted   Groin pain, chronic, right 10/01/2020   Major depressive disorder, recurrent episode (HCC) 03/26/2020   Generalized anxiety disorder 03/26/2020   History reviewed. No pertinent past medical history.  History reviewed. No pertinent family history.  History reviewed. No pertinent surgical history. Social History   Occupational History   Not on file  Tobacco Use   Smoking status: Every Day    Types: Cigarettes   Smokeless tobacco: Never   Tobacco comments:    Smokes a few times a week, ususally with friends, planning on stopping, resources provided  Substance and Sexual Activity   Alcohol use: Yes    Alcohol/week: 2.0 - 3.0 standard drinks    Types: 2 - 3 Cans of beer per week   Drug use: Not on file   Sexual activity: Not on file     Marcus Batman, MD   Note - This record has been created using [employer].  Chart creation errors have been sought, but may not always  have been located. Such creation errors do not reflect on  the standard of medical care.

## 2020-10-29 ENCOUNTER — Ambulatory Visit: Payer: 59 | Admitting: Orthopaedic Surgery

## 2020-11-13 ENCOUNTER — Other Ambulatory Visit (HOSPITAL_COMMUNITY): Payer: Self-pay | Admitting: Psychiatry

## 2020-11-20 ENCOUNTER — Ambulatory Visit (INDEPENDENT_AMBULATORY_CARE_PROVIDER_SITE_OTHER): Payer: 59 | Admitting: Licensed Clinical Social Worker

## 2020-11-20 DIAGNOSIS — F411 Generalized anxiety disorder: Secondary | ICD-10-CM

## 2020-11-20 DIAGNOSIS — F330 Major depressive disorder, recurrent, mild: Secondary | ICD-10-CM

## 2020-11-20 NOTE — Progress Notes (Signed)
   THERAPIST PROGRESS NOTE  Session Time: 35  Virtual Visit via Video Note  I connected with Marcus Sherman on 11/20/20 at 10:00 AM EDT by a video enabled telemedicine application and verified that I am speaking with the correct person using two identifiers.  Location: Patient: Marcus Sherman  Provider: Providers Home    I discussed the limitations of evaluation and management by telemedicine and the availability of in person appointments. The patient expressed understanding and agreed to proceed.   Pt was alert x 5. He was dressed casually and engaged well in therapy session. Marcus Sherman was pleasant, calm, and cooperative. He presented today with euthymic mood/affect. Pt maintained good eye contact and dressed casually.   Pt today stated that he came in to reestablish with therapy. He was being seen by a different provider, but that counselor moved to a different position. Pt states he has anxiety and depression as evidence by PHQ-9 and GAD-7 that were administered today. Pt scored a 17 on GAD-7 and 12 on PHQ-9. 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 Pt reports that he is not on the business side of it and the financials of the business worry him. LCSW used advice giving as to setting up monthly meeting with his brother in laws to go over the financials so he can better understand them and keep up to date.  Marcus Sherman reports that he has been stressed with the news. LCSW spoke with pt about watching the news on his terms such as choosing times to watch it when he can utilize coping skills such as fun hobbies vs listening to it before the start of work. Pt reports that he feels that his medication is not as optimal as they were when he started. LCSW advice pt to speak with medication provider.   Intervention/Plan: Plan for pt is to mediate in the morning 3 x weekly. Pt also agreeable to Setup monthly meetings with brother-in-law for  financial update about their company. Marcus Sherman to reach out to medication provider about increase anxiety and depression. Pt endorses symptoms for tension, worry, restlessness, worthlessness, and irritability. LCSW utilized intervention for praise, encouragement, empowerment, and free association.    I discussed the assessment and treatment plan with the patient. The patient was provided an opportunity to ask questions and all were answered. The patient agreed with the plan and demonstrated an understanding of the instructions.   The patient was advised to call back or seek an in-person evaluation if the symptoms worsen or if the condition fails to improve as anticipated.  I provided 4 minutes of non-face-to-face time during this encounter.   Weber Cooks, LCSW   Participation Level: Active  Behavioral Response: CasualAlertAnxious  Type of Therapy: Individual Therapy  Treatment Goals addressed: Diagnosis: MDD and GAD   Interventions: Motivational Interviewing and Supportive  Suicidal/Homicidal: NAwithout intent/plan  Plan: Return again in 4 weeks.    Weber Cooks, LCSW 11/20/2020

## 2020-12-01 ENCOUNTER — Telehealth (HOSPITAL_COMMUNITY): Payer: 59

## 2020-12-15 ENCOUNTER — Encounter (HOSPITAL_COMMUNITY): Payer: Self-pay

## 2020-12-15 ENCOUNTER — Telehealth (HOSPITAL_COMMUNITY): Payer: 59

## 2020-12-25 ENCOUNTER — Ambulatory Visit (HOSPITAL_COMMUNITY): Payer: 59 | Admitting: Licensed Clinical Social Worker

## 2020-12-25 ENCOUNTER — Telehealth (HOSPITAL_COMMUNITY): Payer: Self-pay | Admitting: Licensed Clinical Social Worker

## 2020-12-25 ENCOUNTER — Encounter (HOSPITAL_COMMUNITY): Payer: Self-pay

## 2020-12-25 NOTE — Telephone Encounter (Signed)
LCSW sent two links to pt phone. 1 at 1603 and another at 1607 with no response. LCSW f/u with a PC at 1410 with no response. HIPAA compliant VM left. LCSW waited until 1615 before disconnecting.

## 2021-01-02 ENCOUNTER — Other Ambulatory Visit: Payer: Self-pay | Admitting: Urology

## 2021-01-05 NOTE — Progress Notes (Signed)
Sent message, via epic in basket, requesting orders in epic from surgeon.  

## 2021-01-15 ENCOUNTER — Ambulatory Visit (INDEPENDENT_AMBULATORY_CARE_PROVIDER_SITE_OTHER): Payer: 59 | Admitting: Licensed Clinical Social Worker

## 2021-01-15 DIAGNOSIS — F330 Major depressive disorder, recurrent, mild: Secondary | ICD-10-CM | POA: Diagnosis not present

## 2021-01-15 DIAGNOSIS — F411 Generalized anxiety disorder: Secondary | ICD-10-CM

## 2021-01-15 NOTE — Progress Notes (Signed)
   THERAPIST PROGRESS NOTE  Virtual Visit via Video Note  I connected with Gussie Towson on 01/15/21 at 10:00 AM EST by a video enabled telemedicine application and verified that I am speaking with the correct person using two identifiers.  Location: Patient: [facility]   Provider: Providers Home    I discussed the limitations of evaluation and management by telemedicine and the availability of in person appointments. The patient expressed understanding and agreed to proceed.  Pt was alert and oriented x 5. He was dressed casually and engaged well in therapy session. Tyberius was pleasant, cooperative, and maintained good eye contact.   Pt primary stressor is family conflict, work, and Education officer, community. 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 The business was not doing well enough to maintain. They completed their last 6 orders last month and then decided to discontinue the business. 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 Pt reports stress/tension due to not have consistency with jobs. Pt reports an increasing depression. He reports that he gets along with most people in his family, however pt reports that others in his family fight a lot. This increases pt irritability, hopelessness, and sadness.   Intervention: LCSW utilized supportive therapy and motivational interviewing in today's session. LCSW used language for praise, encouragement, and empowerment in today's session. LCSW used positive affirmation to help aide the language use above. Pt and LCSW went over GAD-7 score which decrease three points in the past 60 days. Also, pt and LCSW went over PHQ-9 score which went up 1 point. Solution focused therapy was used for resources for [employer].   Plan: Pt to follow up with [employer] and apply for 3 jobs in the next 30 days before next session. Ellen was agreeable to plan.        I discussed the assessment and treatment plan with the patient. The patient was provided an opportunity to ask questions and all were answered. The patient agreed with the plan and demonstrated an understanding of the instructions.   The patient was advised to call back or seek an in-person evaluation if the symptoms worsen or if the condition fails to improve as anticipated.  I provided 40 minutes of non-face-to-face time during this encounter.   Weber Cooks, LCSW   Participation Level: Active  Behavioral Response: CasualAlertAnxious and Depressed  Type of Therapy: Individual Therapy  Treatment Goals addressed: Anxiety, Coping, and Diagnosis: depression   Interventions: CBT, Motivational Interviewing, and Solution Focused  Suicidal/Homicidal: Nowithout intent/plan    Plan: Return again in 4 weeks.     Weber Cooks, LCSW 01/15/2021

## 2021-01-16 NOTE — Patient Instructions (Signed)
DUE TO COVID-19 ONLY ONE VISITOR IS ALLOWED TO COME WITH YOU AND STAY IN THE WAITING ROOM ONLY DURING PRE OP AND PROCEDURE DAY OF SURGERY IF YOU ARE GOING HOME AFTER SURGERY. IF YOU ARE SPENDING THE NIGHT 2 PEOPLE MAY VISIT WITH YOU IN YOUR PRIVATE ROOM AFTER SURGERY UNTIL VISITING  HOURS ARE OVER AT 800 PM AND 1  VISITOR  MAY  SPEND THE NIGHT.                 Marcus Sherman     Your procedure is scheduled on: 01/26/21   Report to [facility] Main  Entrance   Report to admitting at   6:25 AM     Call this number if you have problems the morning of surgery [phone]    Remember: Do not eat food or drink :After Midnight the night before your surgery,        BRUSH YOUR TEETH MORNING OF SURGERY AND RINSE YOUR MOUTH OUT, NO CHEWING GUM CANDY OR MINTS.     Take these medicines the morning of surgery with A SIP OF WATER: Lexapro                                You may not have any metal on your body including              piercings  Do not wear jewelry,  lotions, powders or deodorant             Men may shave face and neck.   Do not bring valuables to the hospital. [facility] IS NOT             RESPONSIBLE   FOR VALUABLES.  Contacts, dentures or bridgework may not be worn into surgery.     Patients discharged the day of surgery will not be allowed to drive home.  IF YOU ARE HAVING SURGERY AND GOING HOME THE SAME DAY, YOU MUST HAVE AN ADULT TO DRIVE YOU HOME AND BE WITH YOU FOR 24 HOURS. YOU MAY GO HOME BY TAXI OR UBER OR ORTHERWISE, BUT AN ADULT MUST ACCOMPANY YOU HOME AND STAY WITH YOU FOR 24 HOURS.  Name and phone number of your driver:  Special Instructions: N/A              Please read over the following fact sheets you were given: _____________________________________________________________________             [facility] - Preparing for Surgery Before surgery, you can play an important role.  Because skin is not sterile, your skin needs to be as free of germs  as possible.  You can reduce the number of germs on your skin by washing with CHG (chlorahexidine gluconate) soap before surgery.  CHG is an antiseptic cleaner which kills germs and bonds with the skin to continue killing germs even after washing. Please DO NOT use if you have an allergy to CHG or antibacterial soaps.  If your skin becomes reddened/irritated stop using the CHG and inform your nurse when you arrive at Short Stay.  You may shave your face/neck. Please follow these instructions carefully:  1.  Shower with CHG Soap the night before surgery and the  morning of Surgery.  2.  If you choose to wash your hair, wash your hair first as usual with your  normal  shampoo.  3.  After you shampoo, rinse your hair  and body thoroughly to remove the  shampoo.                            4.  Use CHG as you would any other liquid soap.  You can apply chg directly  to the skin and wash                       Gently with a scrungie or clean washcloth.  5.  Apply the CHG Soap to your body ONLY FROM THE NECK DOWN.   Do not use on face/ open                           Wound or open sores. Avoid contact with eyes, ears mouth and genitals (private parts).                       Wash face,  Genitals (private parts) with your normal soap.             6.  Wash thoroughly, paying special attention to the area where your surgery  will be performed.  7.  Thoroughly rinse your body with warm water from the neck down.  8.  DO NOT shower/wash with your normal soap after using and rinsing off  the CHG Soap.                9.  Pat yourself dry with a clean towel.            10.  Wear clean pajamas.            11.  Place clean sheets on your bed the night of your first shower and do not  sleep with pets. Day of Surgery : Do not apply any lotions/deodorants the morning of surgery.  Please wear clean clothes to the hospital/surgery center.  FAILURE TO FOLLOW THESE INSTRUCTIONS MAY RESULT IN THE CANCELLATION OF YOUR  SURGERY PATIENT SIGNATURE_________________________________  NURSE SIGNATURE__________________________________  ________________________________________________________________________

## 2021-01-19 ENCOUNTER — Other Ambulatory Visit: Payer: Self-pay

## 2021-01-19 ENCOUNTER — Encounter (HOSPITAL_COMMUNITY)
Admission: RE | Admit: 2021-01-19 | Discharge: 2021-01-19 | Disposition: A | Discharge status: Home / Self Care | Payer: 59 | Source: Ambulatory Visit | Attending: Urology | Admitting: Urology

## 2021-01-19 ENCOUNTER — Encounter (HOSPITAL_COMMUNITY): Payer: Self-pay

## 2021-01-19 DIAGNOSIS — Z01812 Encounter for preprocedural laboratory examination: Secondary | ICD-10-CM | POA: Diagnosis present

## 2021-01-19 HISTORY — DX: Anxiety disorder, unspecified: F41.9

## 2021-01-19 NOTE — Progress Notes (Signed)
COVID test- NA   PCP - Pt saw a Dr.once but can't remember the name or location Cardiologist - no  Chest x-ray - no EKG - no Stress Test - no ECHO - no Cardiac Cath - no Pacemaker/ICD device last checked:NA  Sleep Study - no CPAP -   Fasting Blood Sugar - NA Checks Blood Sugar _____ times a day  Blood Thinner Instructions:NA Aspirin Instructions: Last Dose:  Anesthesia review: no  Patient denies shortness of breath, fever, cough and chest pain at PAT appointment Pt has no SOB with any activities. He has never had anesthesia and is very anxious about it  Patient verbalized understanding of instructions that were given to them at the PAT appointment. Patient was also instructed that they will need to review over the PAT instructions again at home before surgery. yes

## 2021-01-26 ENCOUNTER — Ambulatory Visit (HOSPITAL_COMMUNITY)
Admission: RE | Admit: 2021-01-26 | Discharge: 2021-01-26 | Disposition: A | Discharge status: Home / Self Care | Payer: 59 | Attending: Urology | Admitting: Urology

## 2021-01-26 ENCOUNTER — Encounter (HOSPITAL_COMMUNITY)
Admission: RE | Disposition: A | Discharge status: Home / Self Care | Payer: Self-pay | Source: Home / Self Care | Attending: Urology

## 2021-01-26 ENCOUNTER — Encounter (HOSPITAL_COMMUNITY): Payer: Self-pay | Admitting: Urology

## 2021-01-26 ENCOUNTER — Ambulatory Visit (HOSPITAL_COMMUNITY): Payer: 59 | Admitting: Certified Registered Nurse Anesthetist

## 2021-01-26 DIAGNOSIS — Q53112 Unilateral inguinal testis: Secondary | ICD-10-CM | POA: Diagnosis not present

## 2021-01-26 DIAGNOSIS — F419 Anxiety disorder, unspecified: Secondary | ICD-10-CM | POA: Insufficient documentation

## 2021-01-26 DIAGNOSIS — F32A Depression, unspecified: Secondary | ICD-10-CM | POA: Diagnosis not present

## 2021-01-26 DIAGNOSIS — F17210 Nicotine dependence, cigarettes, uncomplicated: Secondary | ICD-10-CM | POA: Diagnosis not present

## 2021-01-26 DIAGNOSIS — Z302 Encounter for sterilization: Secondary | ICD-10-CM | POA: Insufficient documentation

## 2021-01-26 HISTORY — PX: ORCHIOPEXY: SHX479

## 2021-01-26 SURGERY — ORCHIOPEXY ADULT
Anesthesia: General | Laterality: Bilateral

## 2021-01-26 MED ORDER — DEXMEDETOMIDINE (PRECEDEX) IN NS 20 MCG/5ML (4 MCG/ML) IV SYRINGE
PREFILLED_SYRINGE | INTRAVENOUS | Status: DC | PRN
  Administered 2021-01-26 (×2): 8 ug via INTRAVENOUS

## 2021-01-26 MED ORDER — CHLORHEXIDINE GLUCONATE 0.12 % MT SOLN
15.0000 mL | Freq: Once | OROMUCOSAL | Status: AC
  Administered 2021-01-26: 15 mL via OROMUCOSAL

## 2021-01-26 MED ORDER — FENTANYL CITRATE (PF) 100 MCG/2ML IJ SOLN
INTRAMUSCULAR | Status: AC
  Filled 2021-01-26: qty 2

## 2021-01-26 MED ORDER — OXYCODONE HCL 5 MG PO TABS: 5.0000 mg | ORAL_TABLET | Freq: Once | ORAL | Status: AC | PRN

## 2021-01-26 MED ORDER — BUPIVACAINE HCL 0.25 % IJ SOLN
INTRAMUSCULAR | Status: AC
  Filled 2021-01-26: qty 1

## 2021-01-26 MED ORDER — PROPOFOL 500 MG/50ML IV EMUL
INTRAVENOUS | Status: DC | PRN
  Administered 2021-01-26: 200 mg via INTRAVENOUS

## 2021-01-26 MED ORDER — OXYCODONE HCL 5 MG PO TABS
ORAL_TABLET | ORAL | Status: AC
  Administered 2021-01-26: 5 mg via ORAL
  Filled 2021-01-26: qty 1

## 2021-01-26 MED ORDER — PROMETHAZINE HCL 25 MG/ML IJ SOLN: 6.2500 mg | INTRAMUSCULAR | Status: DC | PRN

## 2021-01-26 MED ORDER — PROPOFOL 10 MG/ML IV BOLUS
INTRAVENOUS | Status: AC
  Filled 2021-01-26: qty 20

## 2021-01-26 MED ORDER — HYDROCODONE-ACETAMINOPHEN 5-325 MG PO TABS: 1.0000 | ORAL_TABLET | ORAL | 0 refills | Status: AC | PRN

## 2021-01-26 MED ORDER — OXYCODONE HCL 5 MG/5ML PO SOLN: 5.0000 mg | Freq: Once | ORAL | Status: AC | PRN

## 2021-01-26 MED ORDER — ORAL CARE MOUTH RINSE: 15.0000 mL | Freq: Once | OROMUCOSAL | Status: AC

## 2021-01-26 MED ORDER — MIDAZOLAM HCL 2 MG/2ML IJ SOLN
INTRAMUSCULAR | Status: AC
  Filled 2021-01-26: qty 2

## 2021-01-26 MED ORDER — AMISULPRIDE (ANTIEMETIC) 5 MG/2ML IV SOLN: 10.0000 mg | Freq: Once | INTRAVENOUS | Status: DC | PRN

## 2021-01-26 MED ORDER — CEFAZOLIN SODIUM-DEXTROSE 2-4 GM/100ML-% IV SOLN
2.0000 g | INTRAVENOUS | Status: AC
  Administered 2021-01-26: 2 g via INTRAVENOUS
  Filled 2021-01-26: qty 100

## 2021-01-26 MED ORDER — ONDANSETRON HCL 4 MG/2ML IJ SOLN
INTRAMUSCULAR | Status: AC
  Filled 2021-01-26: qty 2

## 2021-01-26 MED ORDER — DEXAMETHASONE SODIUM PHOSPHATE 10 MG/ML IJ SOLN
INTRAMUSCULAR | Status: DC | PRN
  Administered 2021-01-26: 10 mg via INTRAVENOUS

## 2021-01-26 MED ORDER — DEXAMETHASONE SODIUM PHOSPHATE 10 MG/ML IJ SOLN
INTRAMUSCULAR | Status: AC
  Filled 2021-01-26: qty 1

## 2021-01-26 MED ORDER — LACTATED RINGERS IV SOLN: INTRAVENOUS | Status: DC

## 2021-01-26 MED ORDER — 0.9 % SODIUM CHLORIDE (POUR BTL) OPTIME
TOPICAL | Status: DC | PRN
  Administered 2021-01-26: 1000 mL

## 2021-01-26 MED ORDER — DEXMEDETOMIDINE (PRECEDEX) IN NS 20 MCG/5ML (4 MCG/ML) IV SYRINGE
PREFILLED_SYRINGE | INTRAVENOUS | Status: AC
  Filled 2021-01-26: qty 5

## 2021-01-26 MED ORDER — ACETAMINOPHEN 500 MG PO TABS
1000.0000 mg | ORAL_TABLET | Freq: Once | ORAL | Status: AC
  Administered 2021-01-26: 1000 mg via ORAL
  Filled 2021-01-26: qty 2

## 2021-01-26 MED ORDER — FENTANYL CITRATE PF 50 MCG/ML IJ SOSY: 25.0000 ug | PREFILLED_SYRINGE | INTRAMUSCULAR | Status: DC | PRN

## 2021-01-26 MED ORDER — BUPIVACAINE HCL (PF) 0.25 % IJ SOLN
INTRAMUSCULAR | Status: DC | PRN
  Administered 2021-01-26: 10 mL

## 2021-01-26 MED ORDER — ONDANSETRON HCL 4 MG/2ML IJ SOLN
INTRAMUSCULAR | Status: DC | PRN
  Administered 2021-01-26: 4 mg via INTRAVENOUS

## 2021-01-26 MED ORDER — LIDOCAINE 2% (20 MG/ML) 5 ML SYRINGE
INTRAMUSCULAR | Status: DC | PRN
  Administered 2021-01-26: 50 mg via INTRAVENOUS

## 2021-01-26 MED ORDER — MIDAZOLAM HCL 2 MG/2ML IJ SOLN
INTRAMUSCULAR | Status: DC | PRN
  Administered 2021-01-26: 2 mg via INTRAVENOUS

## 2021-01-26 MED ORDER — FENTANYL CITRATE (PF) 100 MCG/2ML IJ SOLN
INTRAMUSCULAR | Status: DC | PRN
  Administered 2021-01-26 (×5): 50 ug via INTRAVENOUS

## 2021-01-26 SURGICAL SUPPLY — 32 items
BAG COUNTER SPONGE SURGICOUNT (BAG) IMPLANT
BLADE CLIPPER SURG (BLADE) ×2 IMPLANT
BLADE HEX COATED 2.75 (ELECTRODE) ×2 IMPLANT
BNDG GAUZE ELAST 4 BULKY (GAUZE/BANDAGES/DRESSINGS) ×2 IMPLANT
COVER SURGICAL LIGHT HANDLE (MISCELLANEOUS) ×2 IMPLANT
DERMABOND ADVANCED (GAUZE/BANDAGES/DRESSINGS) ×1
DERMABOND ADVANCED .7 DNX12 (GAUZE/BANDAGES/DRESSINGS) ×1 IMPLANT
DRAIN PENROSE 0.25X18 (DRAIN) IMPLANT
DRAPE LAPAROTOMY T 98X78 PEDS (DRAPES) ×2 IMPLANT
ELECT REM PT RETURN 15FT ADLT (MISCELLANEOUS) ×2 IMPLANT
GLOVE SURG ENC MOIS LTX SZ6.5 (GLOVE) ×1 IMPLANT
GLOVE SURG ENC MOIS LTX SZ7.5 (GLOVE) ×3 IMPLANT
GLOVE SURG UNDER POLY LF SZ6.5 (GLOVE) ×3 IMPLANT
GOWN STRL REUS W/TWL LRG LVL3 (GOWN DISPOSABLE) ×3 IMPLANT
KIT BASIN OR (CUSTOM PROCEDURE TRAY) ×2 IMPLANT
KIT TURNOVER KIT A (KITS) IMPLANT
NEEDLE HYPO 22GX1.5 SAFETY (NEEDLE) ×1 IMPLANT
NS IRRIG 1000ML POUR BTL (IV SOLUTION) ×2 IMPLANT
PACK GENERAL/GYN (CUSTOM PROCEDURE TRAY) ×2 IMPLANT
SOL PREP POV-IOD 4OZ 10% (MISCELLANEOUS) ×2 IMPLANT
SUPPORT SCROTAL LG STRP (MISCELLANEOUS) ×2 IMPLANT
SUT CHROMIC 3 0 SH 27 (SUTURE) ×3 IMPLANT
SUT MON AB 4-0 SH 27 (SUTURE) ×1 IMPLANT
SUT PDS AB 4-0 SH 27 (SUTURE) ×2 IMPLANT
SUT VIC AB 0 UR5 27 (SUTURE) ×1 IMPLANT
SUT VIC AB 2-0 SH 27 (SUTURE) ×3
SUT VIC AB 2-0 SH 27X BRD (SUTURE) IMPLANT
SUT VIC AB 2-0 UR5 27 (SUTURE) ×1 IMPLANT
SUT VICRYL 0 TIES 12 18 (SUTURE) IMPLANT
SYR CONTROL 10ML LL (SYRINGE) ×1 IMPLANT
TOWEL OR 17X26 10 PK STRL BLUE (TOWEL DISPOSABLE) ×3 IMPLANT
WATER STERILE IRR 1000ML POUR (IV SOLUTION) ×1 IMPLANT

## 2021-01-26 NOTE — Anesthesia Preprocedure Evaluation (Addendum)
Anesthesia Evaluation  Patient identified by MRN, date of birth, ID band Patient awake    Reviewed: Allergy & Precautions, NPO status , Patient's Chart, lab work & pertinent test results  Airway Mallampati: II  TM Distance: >3 FB Neck ROM: Full    Dental no notable dental hx.    Pulmonary Current Smoker and Patient abstained from smoking.,    Pulmonary exam normal breath sounds clear to auscultation       Cardiovascular Exercise Tolerance: Good negative cardio ROS Normal cardiovascular exam Rhythm:Regular Rate:Normal     Neuro/Psych PSYCHIATRIC DISORDERS Anxiety Depression    GI/Hepatic negative GI ROS, (+)     substance abuse  marijuana use,   Endo/Other  negative endocrine ROS  Renal/GU negative Renal ROS  negative genitourinary   Musculoskeletal negative musculoskeletal ROS (+)   Abdominal   Peds negative pediatric ROS (+)  Hematology negative hematology ROS (+)   Anesthesia Other Findings   Reproductive/Obstetrics negative OB ROS                            Anesthesia Physical Anesthesia Plan  ASA: 2  Anesthesia Plan: General   Post-op Pain Management:    Induction: Intravenous  PONV Risk Score and Plan: 1 and Treatment may vary due to age or medical condition, Ondansetron and Dexamethasone  Airway Management Planned: Oral ETT  Additional Equipment: None  Intra-op Plan:   Post-operative Plan: Extubation in OR  Informed Consent: I have reviewed the patients History and Physical, chart, labs and discussed the procedure including the risks, benefits and alternatives for the proposed anesthesia with the patient or authorized representative who has indicated his/her understanding and acceptance.     Dental advisory given  Plan Discussed with: CRNA, Anesthesiologist and Surgeon  Anesthesia Plan Comments:         Anesthesia Quick Evaluation

## 2021-01-26 NOTE — Anesthesia Procedure Notes (Signed)
Procedure Name: LMA Insertion Date/Time: 01/26/2021 8:47 AM Performed by: Basilio Cairo, CRNA Pre-anesthesia Checklist: Patient identified, Patient being monitored, Timeout performed, Emergency Drugs available and Suction available Patient Re-evaluated:Patient Re-evaluated prior to induction Oxygen Delivery Method: Circle system utilized Preoxygenation: Pre-oxygenation with 100% oxygen Induction Type: IV induction Ventilation: Mask ventilation without difficulty LMA: LMA inserted LMA Size: 4.0 Tube type: Oral Number of attempts: 1 Placement Confirmation: positive ETCO2 and breath sounds checked- equal and bilateral Tube secured with: Tape Dental Injury: Teeth and Oropharynx as per pre-operative assessment

## 2021-01-26 NOTE — Op Note (Signed)
Operative Note  Preoperative diagnosis:  1.  Desired infertility 2.  Right undescended testicle  Postoperative diagnosis: 1.  Same  Procedure(s): 1.  Bilateral vasectomy 2.  Right inguinal orchiopexy  Surgeon: Modena Slater, MD  Assistants: None  Anesthesia: General  Complications: None immediate  EBL: Minimal  Specimens: 1.  None  Drains/Catheters: 1.  None  Intraoperative findings: 1.  Bilateral vas deferens divided. 2.  Right inguinal undescended testicle.  After releasing attachments, there was good length and able to extend the testicle into the scrotum.  Orchiopexy able to be performed.  Prior to orchiopexy, Doppler was used to confirm good blood flow to the testicle.  Indication: 35 year old male desires infertility.  He also had a right undescended testicle.  Scrotal ultrasound showed a smaller right testicle but no masses.  Description of procedure:  The patient was identified and consent was obtained.  The patient was taken to the operating room and placed in the supine position.  The patient was placed under general anesthesia.  Perioperative antibiotics were administered.  The patient was placed in dorsal lithotomy.  Patient was prepped and draped in a standard sterile fashion and a timeout was performed.  An approximately 4 cm scrotal incision was made along the median raphae.  This was carried down through the dartos and the left testicle was delivered onto the operative field.  Spot electrocautery was used for hemostasis.  A vas clamp was used to grasp the vas deferens.  Overlying tissue was dissected off with a sharp hemostat.  A Kelly clamp was used to clamp the vas deferens proximally and distally.  Vas deferens was divided at midline.  Electrocautery was used on proximal and distal ends.  3-0 chromic was used to tie off both ends as well.  Clamps were released.  There was no evidence of any bleeding.  Spot electrocautery was used for hemostasis.  The testicle  was delivered back into the scrotum in its proper anatomical position.  The dartos was developed on the right into the right hemiscrotum.  I then made an approximately 6 cm inguinal incision sharply.  This was carried down with Bovie electrocautery through camper's and Scarpa's fascia.  External oblique was identified.  A tiny punch incision was made with scalpel.  This was extended proximally and distally with Metzenbaum scissors.  Careful dissection was performed and I passed my finger underneath the spermatic cord.  The testicle was palpated within the inguinal canal.  Gubernacular attachments were divided.  All attachments were divided until only the cord remained.  Identified the vas deferens.  A Kelly clamp was placed in the proximal and distal portions of the vas deferens.  This was divided.  Electrocautery was used on each end and a 3-0 chromic was used to tie off each end.  Kelly clamp was released and there was no evidence of any bleeding.  Further attachments were released from the cord to give it enough length to reach the scrotum.  A tonsil was passed from the right hemiscrotum into the inguinal incision.  The testicle was grasped and pulled into the scrotum.  I delivered the testicle through the scrotal incision and onto the operative field.  A 4-0 PDS suture was then used to pex the right testicle to the median dartos.  Doppler was used to interrogate the cord and this clearly identified good blood flow to the testicle and the testicle was delivered back into the right hemiscrotum and the PDS sutures were secured down.  The  dartos was closed with a running 3-0 Vicryl followed by closure of the skin with 4-0 Monocryl.  I then closed the external oblique aponeurosis with running 2-0 Vicryl.  Care was taken not to incorporate the ilioinguinal nerve.  A running 2-0 Vicryl was used to close camper/Scarpa fascia in a running fashion.  I then closed the skin with a subcuticular 4-0 Monocryl.  Quarter percent  Marcaine was instilled for anesthetic effect.  Dermabond was applied to the incisions.  This concluded the operation.  Patient tolerated the procedure well was stable postoperative.  Plan: Follow-up in 1 month for postoperative check.

## 2021-01-26 NOTE — Anesthesia Postprocedure Evaluation (Signed)
Anesthesia Post Note  Patient: Marcus Sherman  Procedure(s) Performed: RIGHT INGUINAL ORCHIOPEXY ADULT BILATERAL VASECTOMY (Bilateral)     Patient location during evaluation: PACU Anesthesia Type: General Level of consciousness: awake Pain management: pain level controlled Vital Signs Assessment: post-procedure vital signs reviewed and stable Respiratory status: spontaneous breathing and respiratory function stable Cardiovascular status: stable Postop Assessment: no apparent nausea or vomiting Anesthetic complications: no   No notable events documented.  Last Vitals:  Vitals:   01/26/21 1145 01/26/21 1200  BP:  134/90  Pulse: 78 73  Resp:  18  Temp:    SpO2: 99% 100%    Last Pain:  Vitals:   01/26/21 1200  TempSrc:   PainSc: 0-No pain                 Mellody Dance

## 2021-01-26 NOTE — Transfer of Care (Signed)
Immediate Anesthesia Transfer of Care Note  Patient: Marcus Sherman  Procedure(s) Performed: Procedure(s): RIGHT INGUINAL ORCHIOPEXY ADULT BILATERAL VASECTOMY (Bilateral)  Patient Location: PACU  Anesthesia Type:General  Level of Consciousness: Alert, Awake, Oriented  Airway & Oxygen Therapy: Patient Spontanous Breathing  Post-op Assessment: Report given to RN  Post vital signs: Reviewed and stable  Last Vitals:  Vitals:   01/26/21 0611  BP: (!) 146/79  Pulse: 100  Resp: 18  Temp: 37.2 C  SpO2: 100%    Complications: No apparent anesthesia complications

## 2021-01-26 NOTE — H&P (Signed)
CC: I want to discuss a vasectomy.  HPI: Marcus Sherman is a 35 year-old male established patient who is here to discuss a vasectomy.  He has read the Vasectomy Patient Info Book provided to him. He does understand that a vasectomy is permanent. He is certain that he and his mate do not want any more children. He has 0 children. All of the patient's children are healthy.   He does understand that pregnancy after a vasectomy reversal is not assured. He does understand that reestablishment of sperm flow through the vas deferens is extremely rare but possible and may result in an unexpected pregnancy at any time after his vasectomy. He does understand that birth control protection must be used after his vasectomy until he has had 2 clear sperm counts and is cleared for unprotected intercourse.   Patient has no children. He is divorced as he did not want to have children.   Patient also has an absent right testicle. He does not recall ever having surgery on that side. He states he had poor medical care growing up.   12/30/2020  Since last seeing the patient, he underwent a scrotal ultrasound. This revealed a slightly atrophic right testicle within the inguinal canal that measured 3.6 x 1.3 x 1.7 cm. No obvious lesions. He would like to avoid orchiectomy if possible. Left testicle normal.     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 Has smoked since 07/17/2010. Smokes 1/2 pack per day.   Tobacco Use Assessment Completed: Used Tobacco in last 30 days? Does not use smokeless  tobacco. Drinks 2 drinks per day.  Does not use drugs. Drinks 3 caffeinated drinks per day. 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    REVIEW OF SYSTEMS:    GU Review Male:   Patient denies frequent urination, hard to postpone urination, burning/ pain with urination, get up at night to urinate, leakage of urine, stream starts and stops, trouble starting your stream, have to strain to urinate , erection problems, and penile pain.  Gastrointestinal (Upper):   Patient denies nausea, vomiting, and indigestion/ heartburn.  Gastrointestinal (Lower):   Patient denies diarrhea and constipation.  Constitutional:   Patient denies fever, night sweats, weight loss, and fatigue.  Skin:   Patient denies skin rash/ lesion and itching.  Eyes:   Patient denies blurred vision and double vision.  Ears/ Nose/ Throat:   Patient denies sore throat and sinus problems.  Hematologic/Lymphatic:   Patient denies swollen glands and easy bruising.  Cardiovascular:   Patient denies leg swelling and chest pains.  Respiratory:   Patient denies cough and shortness of breath.  Endocrine:   Patient denies excessive thirst.  Musculoskeletal:   Patient denies back pain and joint pain.  Neurological:   Patient denies headaches and dizziness.  Psychologic:   Patient denies depression and anxiety.   VITAL SIGNS: None   GU PHYSICAL EXAMINATION:    Testes: Left testicle descended without masses. Vas deferens easily palpable. On the right side I was unable to palpate a testicle  Penis: Circumcised, no warts, no cracks. No dorsal Peyronie's plaques, no left corporal Peyronie's plaques, no right corporal Peyronie's plaques, no scarring, no warts. No balanitis, no meatal stenosis.   MULTI-SYSTEM PHYSICAL EXAMINATION:       Complexity of Data:  Source Of History:  Patient  X-Ray Review: Scrotal Ultrasound: Reviewed Films. Reviewed Report. Discussed With Patient.     PROCEDURES: None   ASSESSMENT:      ICD-10 Details  1  GU:   Encounter for sterilization - Z30.2 Chronic, Stable  2   Undescended Testicle,Unspec, Unilateral - Q53.10 Undiagnosed New Problem   PLAN:           Document Letter(s):  Created for Patient: Clinical Summary         Notes:   Plan for right inguinal orchiopexy with possible orchiectomy and bilateral vasectomy. Risk and benefits again discussed.   We discussed the procedure today, going over the incision. We went over the pertinent anatomy of the scrotum and vasa. I have discussed with the patient the single midline incision that I use and the fact that the procedure is office-based.  We discussed the fact that the patient must absolutely be sure that he does not want to father a child under any circumstances and was counseled to talk with his partner regarding this decision. We then discussed the potential risks and complications of this procedure, including but not limited to epididymitis with typically transient discomfort and swelling after the procedure, which can occur on one or both sides; sperm granuloma, which was described in detail as being caused by the presence of some sperm leaking out from the severed ends of the vasa, causing an inflammatory reaction and oftentimes resulting in a small round area of scar that may persist; hematoma and bleeding into the scrotum, which can be significant and possibly require a second incision to drain the accumulated blood, as well as how to prevent this by decreased activity level. We discussed superficial skin infection, which is rare, and abscess formation, which is exceedingly rare and may require drainage as well. Finally, we went over recanalization, which can occur in 1 in 2000 cases and how to determine this and prevent this from occurring.  The patient was given written information regarding all of these risks and complications. Also, informed of preoperative preparation of shaving the scrotum, and given written information indicating that  immediately after the procedure continued birth control needs to be used until 1 semen specimen is noted to be free of sperm.   CC: Dr. Nicholos Johns    Signed by Modena Slater, III, M.D. on 12/30/20 at 3:09 PM (EST

## 2021-01-26 NOTE — Discharge Instructions (Signed)
Discharge instructions following scrotal surgery  Call your doctor for: Fever is greater than 100.5 Severe nausea or vomiting Increasing pain not controlled by pain medication Increasing redness or drainage from incisions  The number for questions or concerns is [phone]  Activity level: No lifting greater than 20 pounds (about equal to milk) for the next 2 weeks or until cleared to do so at follow-up appointment.  Otherwise activity as tolerated by comfort level.  Diet: May resume your regular diet as tolerated  Driving: No driving while still taking opiate pain medications (weight at least 6-8 hours after last dose).  No driving if you still sore from surgery as it may limit her ability to react quickly if necessary.   Shower/bath: May shower and get incision wet pad dry immediately following.  Do not scrub vigorously for the next 2-3 weeks.  Do not soak incision (ID soaking in bath or swimming) until told he may do so by Dr., as this may promote a wound infection.  Wound care: He may cover wounds with sterile gauze as needed to prevent incisions rubbing on close follow-up in any seepage.  Where tight fitting underpants/scrotal support for at least 2 weeks.  He should apply cold compresses (ice or sac of frozen peas/corn) to your scrotum for at least 48 hours to reduce the swelling for 15 minutes at a time indirectly.  You should expect that his scrotum will swell up initially and then get smaller over the next 2-4 weeks.  Follow-up appointments: Follow-up appointment will be scheduled with Dr. Sanders Manninen for a wound check.  

## 2021-01-27 ENCOUNTER — Encounter (HOSPITAL_COMMUNITY): Payer: Self-pay | Admitting: Urology

## 2021-02-23 ENCOUNTER — Ambulatory Visit: Payer: Self-pay

## 2021-03-02 ENCOUNTER — Ambulatory Visit (INDEPENDENT_AMBULATORY_CARE_PROVIDER_SITE_OTHER): Payer: 59 | Admitting: Licensed Clinical Social Worker

## 2021-03-02 DIAGNOSIS — F411 Generalized anxiety disorder: Secondary | ICD-10-CM

## 2021-03-02 DIAGNOSIS — F330 Major depressive disorder, recurrent, mild: Secondary | ICD-10-CM

## 2021-03-02 NOTE — Progress Notes (Signed)
° °  THERAPIST PROGRESS NOTE  Virtual Visit via Video Note  I connected with Marcus Sherman on 03/02/21 at  2:00 PM EST by a video enabled telemedicine application and verified that I am speaking with the correct person using two identifiers.  Location: Patient: [facility]  Provider: Providers Home    I discussed the limitations of evaluation and management by telemedicine and the availability of in person appointments. The patient expressed understanding and agreed to proceed.      I discussed the assessment and treatment plan with the patient. The patient was provided an opportunity to ask questions and all were answered. The patient agreed with the plan and demonstrated an understanding of the instructions.   The patient was advised to call back or seek an in-person evaluation if the symptoms worsen or if the condition fails to improve as anticipated.  I provided 40 minutes of non-face-to-face time during this encounter.   Dory Horn, LCSW   Participation Level: Active  Behavioral Response: CasualAlertAnxious and Depressed  Type of Therapy: Individual Therapy  Treatment Goals addressed: Anxiety and Coping  Interventions: CBT and Supportive    Suicidal/Homicidal: Nowithout intent/plan  Therapist Response:    Pt was alert and oriented x 5. He was dressed casually and engaged well in therapy session. Marcus Sherman was pleasant, cooperative and maintain good eye contact. He presented today with euthymic and pleasant mood/affect.   Primary stressor is work for pt. He reports that he started a new job at [employer], he helps maintain inventory, stage tables, and create delivery schedules. Pt reports that there is poor communication within the staff that is making his job more stressful. Pt reports that he will be talking to his boss on how to improve this. Pt is also maintaining employment in his [place] about 3 hours 5 x weekly. [place] for pt is to  eventually be working full time hour with 1 company to decrease stress.   Interventions/Plan: LCSW utilized CBT, supportive therapy, and person-centered therapy. LCSW used praise and encouraged. LCSW use cognitive restructuring. LCSW used empowerment. LCSW educated pt on signs and symptoms for depression and anxiety. Plan for pt is to utilized positive affirmation 1 x daily. LCSW sent those positive affirmation via email. F/u scheduled for 4 weeks.    Plan: Return again in 4 weeks.     Dory Horn, LCSW 03/02/2021

## 2021-03-26 ENCOUNTER — Ambulatory Visit (INDEPENDENT_AMBULATORY_CARE_PROVIDER_SITE_OTHER): Payer: 59 | Admitting: Licensed Clinical Social Worker

## 2021-03-26 DIAGNOSIS — F411 Generalized anxiety disorder: Secondary | ICD-10-CM

## 2021-03-26 DIAGNOSIS — F330 Major depressive disorder, recurrent, mild: Secondary | ICD-10-CM | POA: Diagnosis not present

## 2021-03-26 NOTE — Progress Notes (Signed)
° °  THERAPIST PROGRESS NOTE  Virtual Visit via Video Note  I connected with Jwan Hornbaker on 03/26/21 at  2:00 PM EST by a video enabled telemedicine application and verified that I am speaking with the correct person using two identifiers.  Location: Patient: [facility]  Provider: [facility]    I discussed the limitations of evaluation and management by telemedicine and the availability of in person appointments. The patient expressed understanding and agreed to proceed.      I discussed the assessment and treatment plan with the patient. The patient was provided an opportunity to ask questions and all were answered. The patient agreed with the plan and demonstrated an understanding of the instructions.   The patient was advised to call back or seek an in-person evaluation if the symptoms worsen or if the condition fails to improve as anticipated.  I provided 40 minutes of non-face-to-face time during this encounter.   Weber Cooks, LCSW   Participation Level: Active  Behavioral Response: CasualAlertAnxious and Depressed  Type of Therapy: Individual Therapy  Treatment Goals addressed: Anxiety and Coping  Interventions: CBT and Motivational Interviewing  Summary: Cheryl Stabenow is a 36 y.o. male who presents with euthymic mood\affect.  Patient was pleasant, cooperative, maintained good eye contact.  He was dressed casually engaged well in therapy session.  Diyan was alert and oriented x5.  Primary stressor for patient today's communication, work, financial's, and family conflict.  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  He also reports that things have been going a lot better for his sister and brothers in law's business as they have  changed their business models to building cabinetry based on orders rather than building up inventory before it is sold.  Zeeshan reports some family conflict with his twin sister.  Patient reports that his twin sister has narcissistic tendencies.  He reports trying to set proper boundaries without being to mean.  Patient utilized the example of going down to [place] to see a band and is attempting to keep this from his sister as they have similar taste in music and she he will take a long.   Suicidal/Homicidal: Nowithout intent/plan  Therapist Response:     Intervention/Plan: Interventions utilized in today's session were for supportive therapy, cognitive behavioral therapy and motivational interviewing.  Patient and LCSW spoke about focusing on what we can control versus what we cannot.  LCSW spoke about embracing the past and being able to cope with it properly with external coping skills such as working out, work, meditation, Catering manager.  LCSW utilized open-ended questions, positive affirmations, and reflective listening in today's session.  LCSW utilized reframing and cognitive restructuring techniques.  Plan for patient is to continue to work on decreasing PHQ-9 and GAD-7 while continuing to utilize coping skills listed above. Plan: Return again in 4 weeks.      Weber Cooks, LCSW 03/26/2021

## 2021-03-31 ENCOUNTER — Other Ambulatory Visit: Payer: Self-pay

## 2021-03-31 ENCOUNTER — Telehealth (INDEPENDENT_AMBULATORY_CARE_PROVIDER_SITE_OTHER): Payer: 59 | Admitting: Psychiatry

## 2021-03-31 DIAGNOSIS — F411 Generalized anxiety disorder: Secondary | ICD-10-CM

## 2021-03-31 DIAGNOSIS — F331 Major depressive disorder, recurrent, moderate: Secondary | ICD-10-CM | POA: Diagnosis not present

## 2021-03-31 MED ORDER — ESCITALOPRAM OXALATE 10 MG PO TABS: 10.0000 mg | ORAL_TABLET | Freq: Every day | ORAL | 2 refills | Status: DC

## 2021-03-31 NOTE — Progress Notes (Signed)
BH MD/PA/NP OP Progress Note  03/31/2021 1:40 PM Marcus Sherman  MRN:  [id number] Virtual Visit via Video Note  I connected with Marcus Sherman on 03/31/21 at  1:00 PM EST by a video enabled telemedicine application and verified that I am speaking with the correct person using two identifiers.  Location: Patient: car Provider: clinic   I discussed the limitations of evaluation and management by telemedicine and the availability of in person appointments. The patient expressed understanding and agreed to proceed.   I discussed the assessment and treatment plan with the patient. The patient was provided an opportunity to ask questions and all were answered. The patient agreed with the plan and demonstrated an understanding of the instructions.   The patient was advised to call back or seek an in-person evaluation if the symptoms worsen or if the condition fails to improve as anticipated.  I provided 10 minutes of non-face-to-face time during this encounter.   Mcneil Sober, NP   Chief Complaint: Follow up for medication management.  HPI: Marcus Sherman is a 36 year old male presenting to [facility] for follow-up psychiatric evaluation and medication management. Patient has a history of major depressive disorder and generalized anxiety disorder. His symptoms are managed with escitalopram 10 mg daily. Patient reports medication compliance and denies adverse reactions. He reports previous sexual dysfunction which has resolved. He states that his anxiety and depressive symptoms have improved and he is able to function satisfactory on his new job. Patient denies the need for a medication adjustment today. He is alert and oriented x 4, pleasant and willing to engage. His speech is clear and coherent and of normal rate, volume and tone. Patient has logical thought. He denies suicidal or homicidal ideations, paranoia, delusions, or auditory or visual  hallucinations.  Visit Diagnosis:    ICD-10-CM   1. Major depressive disorder, recurrent episode, moderate (HCC)  F33.1     2. Generalized anxiety disorder  F41.1       Past Psychiatric History: Major depressive disorder, generalized anxiety disorder  Past Medical History:  Past Medical History:  Diagnosis Date   Anxiety     Past Surgical History:  Procedure Laterality Date   NO PAST SURGERIES     ORCHIOPEXY Bilateral 01/26/2021   Procedure: RIGHT INGUINAL ORCHIOPEXY ADULT BILATERAL VASECTOMY;  Surgeon: Crista Elliot, MD;  Location: [facility];  Service: Urology;  Laterality: Bilateral;    Family Psychiatric History: oldest sister: bipolar. Mother: depression. Maternal grandfather: completed suicide  Family History: No family history on file.  Social History:  Social History   Socioeconomic History   Marital status: Divorced    Spouse name: Not on file   Number of children: Not on file   Years of education: Not on file   Highest education level: Not on file  Occupational History   Not on file  Tobacco Use   Smoking status: Some Days    Years: 10.00    Types: Cigarettes   Smokeless tobacco: Never   Tobacco comments:    Smokes a few times a week, ususally with friends, planning on stopping, resources provided  Vaping Use   Vaping Use: Never used  Substance and Sexual Activity   Alcohol use: Yes    Alcohol/week: 3.0 - 4.0 standard drinks    Types: 3 - 4 Cans of beer per week   Drug use: Yes    Frequency: 7.0 times per week    Types: Marijuana   Sexual activity: Not  on file  Other Topics Concern   Not on file  Social History Narrative   Not on file   Social Determinants of Health   Financial Resource Strain: Not on file  Food Insecurity: Not on file  Transportation Needs: Not on file  Physical Activity: Not on file  Stress: Not on file  Social Connections: Not on file    Allergies: No Known Allergies  Metabolic Disorder Labs: No results found  for: HGBA1C, MPG No results found for: PROLACTIN No results found for: CHOL, TRIG, HDL, CHOLHDL, VLDL, LDLCALC No results found for: TSH  Therapeutic Level Labs: No results found for: LITHIUM No results found for: VALPROATE No components found for:  CBMZ  Current Medications: Current Outpatient Medications  Medication Sig Dispense Refill   escitalopram (LEXAPRO) 10 MG tablet Take 1 tablet (10 mg total) by mouth daily. 30 tablet 2   HYDROcodone-acetaminophen (NORCO) 5-325 MG tablet Take 1 tablet by mouth every 4 (four) hours as needed for moderate pain. 12 tablet 0   No current facility-administered medications for this visit.     Musculoskeletal: Strength & Muscle Tone:  virtual n/a Gait & Station: virtual n/a Patient leans: virtual n/a  Psychiatric Specialty Exam: Review of Systems  Psychiatric/Behavioral:  Negative for agitation, behavioral problems, confusion, decreased concentration, dysphoric mood, hallucinations, self-injury, sleep disturbance and suicidal ideas. The patient is not nervous/anxious and is not hyperactive.   All other systems reviewed and are negative.  There were no vitals taken for this visit.There is no height or weight on file to calculate BMI.  General Appearance: Well Groomed  Eye Contact:  Good  Speech:  Clear and Coherent  Volume:  Normal  Mood:  Euthymic  Affect:  Appropriate  Thought Process:  Coherent  Orientation:  Full (Time, Place, and Person)  Thought Content: Logical   Suicidal Thoughts:  No  Homicidal Thoughts:  No  Memory:  Immediate;   Good Recent;   Good Remote;   Good  Judgement:  Good  Insight:  Good  Psychomotor Activity:  Normal  Concentration:  Concentration: Good and Attention Span: Good  Recall:  Good  Fund of Knowledge: Good  Language: Good  Akathisia:  Negative  Handed:  Right  AIMS (if indicated): not done  Assets:  Communication Skills Desire for Improvement  ADL's:  Intact  Cognition: WNL  Sleep:  Good    Screenings: GAD-7    Advertising copywriter from 03/02/2021 in [facility] Counselor from 01/15/2021 in [facility] Counselor from 11/20/2020 in [facility]  Total GAD-7 Score 7 14 17       PHQ2-9    Flowsheet Row Counselor from 03/02/2021 in [facility] Counselor from 01/15/2021 in [facility] Counselor from 11/20/2020 in [facility] Video Visit from 04/21/2020 in [facility] Counselor from 03/26/2020 in [facility]  PHQ-2 Total Score 1 5 2 1 2   PHQ-9 Total Score 5 13 12 2 10       Flowsheet Row Pre-Admission Testing 59 from 01/19/2021 in [facility] Counselor from 11/20/2020 in [facility] Video Visit from 04/21/2020 in [facility]  C-SSRS RISK CATEGORY No Risk Low Risk No Risk        Assessment and Plan: Farah Schirtzinger is a 36 year old male presenting to [facility] for follow-up psychiatric evaluation  and medication management. Patient has a history of major depressive disorder and generalized anxiety disorder. His symptoms are managed with escitalopram 10 mg daily. Patient reports medication compliance and denies adverse reactions. He reports previous sexual dysfunction which has resolved. He states that his anxiety and depressive symptoms have improved and he is able to function satisfactory on his new job. Patient denies the need for a medication adjustment today. Medication refilled and e-scribed to patient's preferred pharmacy.  Treatment/Medication:  1. Major depressive disorder, recurrent episode, moderate (HCC) Meds ordered this encounter  Medications   escitalopram (LEXAPRO) 10 MG tablet    Sig: Take 1 tablet (10 mg total) by  mouth daily.    Dispense:  30 tablet    Refill:  2    Order Specific Question:   Supervising Provider    Answer:   Nelly Rout [[id number]]     2. Generalized anxiety disorder Meds ordered this encounter  Medications   escitalopram (LEXAPRO) 10 MG tablet    Sig: Take 1 tablet (10 mg total) by mouth daily.    Dispense:  30 tablet    Refill:  2    Order Specific Question:   Supervising Provider    Answer:   Nelly Rout [[id number]]    Return to care in 3 months.   Patient/Guardian was advised Release of Information must be obtained prior to any record release in order to collaborate their care with an outside provider. Patient/Guardian was advised if they have not already done so to contact the registration department to sign all necessary forms in order for [place] to release information regarding their care.   Consent: Patient/Guardian gives verbal consent for treatment and assignment of benefits for services provided during this telehealth visit. Patient/Guardian expressed understanding and agreed to proceed.    Mcneil Sober, NP 03/31/2021, 1:40 PM

## 2021-04-16 ENCOUNTER — Ambulatory Visit (INDEPENDENT_AMBULATORY_CARE_PROVIDER_SITE_OTHER): Payer: 59 | Admitting: Licensed Clinical Social Worker

## 2021-04-16 DIAGNOSIS — F411 Generalized anxiety disorder: Secondary | ICD-10-CM

## 2021-04-16 DIAGNOSIS — F330 Major depressive disorder, recurrent, mild: Secondary | ICD-10-CM

## 2021-04-16 NOTE — Progress Notes (Signed)
? ?  THERAPIST PROGRESS NOTE ? ?Virtual Visit via Video Note ? ?I connected with Marcus Sherman on 04/16/21 at  3:00 PM EST by a video enabled telemedicine application and verified that I am speaking with the correct person using two identifiers. ? ?Location: ?Patient: Marcus Sherman  ?Provider: Providers Home.  ?  ?I discussed the limitations of evaluation and management by telemedicine and the availability of in person appointments. The patient expressed understanding and agreed to proceed. ? ?  ?I discussed the assessment and treatment plan with the patient. The patient was provided an opportunity to ask questions and all were answered. The patient agreed with the plan and demonstrated an understanding of the instructions. ?  ?The patient was advised to call back or seek an in-person evaluation if the symptoms worsen or if the condition fails to improve as anticipated. ? ?I provided 40 minutes of non-face-to-face time during this encounter. ? ? ?Adam S Goldammer, LCSW  ?Participation Level: Active ? ?Behavioral Response: CasualAlertEuthymic ? ?Type of Therapy: Individual Therapy ? ?Treatment Goals addressed: Decreasing PHQ-9 below 10  ?ProgressTowards Goals: Met ? ?Interventions: CBT and Motivational Interviewing ? ? ? ?Suicidal/Homicidal: Nowithout intent/plan ? ?Therapist Response:  ? ? ? Pt was alert and oriented x 5. He was dressed casually and engaged well in therapy session. He presented with euthymic mood/affect. He engaged well in therapy session and was pleasant/cooperative.  ? Pt reports everything has been going ?well? He reports have two jobs which have been beneficial for the financial situation. [place] states he has started a new relationship with an ex-girlfriend but states they are not dating but ?talking?. He reports that his communication has been benefited by therapy which has improved all relationships in his life.  ? Intervention: LCSW admitted both a PHQ-9 and GAD-7. Both scores were below  5s which was a goal/objective for pt. LCSW spoke with pt about discharge in today's session and pt was agreeable due to decrease in depression and anxiety. Pt to have discharge session in March with this LCSW   ? ?Plan: Return again in 4 weeks. ? ?Diagnosis: Generalized anxiety disorder ? ?Mild episode of recurrent major depressive disorder (HCC) ? ?Collaboration of Care: Other none in this session  ? ?Patient/Guardian was advised Release of Information must be obtained prior to any record release in order to collaborate their care with an outside provider. Patient/Guardian was advised if they have not already done so to contact the registration department to sign all necessary forms in order for us to release information regarding their care.  ? ?Consent: Patient/Guardian gives verbal consent for treatment and assignment of benefits for services provided during this visit. Patient/Guardian expressed understanding and agreed to proceed.  ? ?Adam S Goldammer, LCSW ?04/16/2021 ? ?

## 2021-05-08 ENCOUNTER — Ambulatory Visit (HOSPITAL_COMMUNITY): Payer: 59 | Admitting: Licensed Clinical Social Worker

## 2021-05-11 ENCOUNTER — Encounter (HOSPITAL_COMMUNITY): Payer: Self-pay

## 2021-05-11 ENCOUNTER — Telehealth (HOSPITAL_COMMUNITY): Payer: Self-pay | Admitting: Licensed Clinical Social Worker

## 2021-05-11 ENCOUNTER — Ambulatory Visit (HOSPITAL_COMMUNITY): Payer: Commercial Managed Care - HMO | Admitting: Licensed Clinical Social Worker

## 2021-05-11 NOTE — Telephone Encounter (Signed)
LCSW sent two links to pt phone with no response. LCSW f/u with PC after  links sent and PC went to VM. HIPAA compliant VM left.  ?

## 2021-06-23 ENCOUNTER — Telehealth (INDEPENDENT_AMBULATORY_CARE_PROVIDER_SITE_OTHER): Payer: Commercial Managed Care - HMO | Admitting: Psychiatry

## 2021-06-23 DIAGNOSIS — F411 Generalized anxiety disorder: Secondary | ICD-10-CM | POA: Diagnosis not present

## 2021-06-23 DIAGNOSIS — F330 Major depressive disorder, recurrent, mild: Secondary | ICD-10-CM

## 2021-06-23 MED ORDER — ESCITALOPRAM OXALATE 5 MG PO TABS: 5.0000 mg | ORAL_TABLET | Freq: Every day | ORAL | 1 refills | Status: DC

## 2021-06-23 MED ORDER — BUPROPION HCL ER (SR) 150 MG PO TB12: 150.0000 mg | ORAL_TABLET | Freq: Every day | ORAL | 1 refills | Status: DC

## 2021-06-23 NOTE — Progress Notes (Signed)
BH MD/PA/NP OP Progress Note ? ?06/23/2021 2:52 PM ?Marcus CoolerMichael Vanwingerden  ?MRN:  [id number] ? ?Virtual Visit via Telephone Note ? ?I connected with Marcus Sherman on 06/23/21 at  1:30 PM EDT by telephone and verified that I am speaking with the correct person using two identifiers. ? ?Location: ?Patient: home ?Provider: offsite ?  ?I discussed the limitations, risks, security and privacy concerns of performing an evaluation and management service by telephone and the availability of in person appointments. I also discussed with the patient that there may be a patient responsible charge related to this service. The patient expressed understanding and agreed to proceed. ? ?  ?I discussed the assessment and treatment plan with the patient. The patient was provided an opportunity to ask questions and all were answered. The patient agreed with the plan and demonstrated an understanding of the instructions. ?  ?The patient was advised to call back or seek an in-person evaluation if the symptoms worsen or if the condition fails to improve as anticipated. ? ?I provided 10 minutes of non-face-to-face time during this encounter. ? ? ?Mcneil Sobericely Berl Bonfanti, NP  ? ?Chief Complaint: Medication management ? ?HPI: Marcus Sherman is a 36 year old [facility] follow[facility]up psychiatric evaluation.  Patient has a psychiatric history of generalized anxiety disorder and major depressive disorder.  Patient symptoms are managed with Lexapro 10 mg daily.  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  Patient is open to decreasing Lexapro and initiating Wellbutrin today.  Medication benefits versus risk discussed. Wellbutrin SR 150 mg daily ordered and Lexapro decreased to 5 mg daily. Patient informed to notify provider if depressive symptoms worsen. ? ?Visit Diagnosis:  ?  ICD-10-CM   ?1. Mild episode of  recurrent major depressive disorder (HCC)  F33.0   ?  ?2. Generalized anxiety disorder  F41.1   ?  ? ? ?Past Psychiatric History: Major depressive disorder and generalized anxiety disorder ? ?Past Medical History:  ?Past Medical History:  ?Diagnosis Date  ? Anxiety   ?  ?Past Surgical History:  ?Procedure Laterality Date  ? NO PAST SURGERIES    ? ORCHIOPEXY Bilateral 01/26/2021  ? Procedure: RIGHT INGUINAL ORCHIOPEXY ADULT BILATERAL VASECTOMY;  Surgeon: Crista ElliotBell, Eugene D III, MD;  Location: [facility];  Service: Urology;  Laterality: Bilateral;  ? ? ?Family Psychiatric History: N/A ? ?Family History: No family history on file. ? ?Social History:  ?Social History  ? ?Socioeconomic History  ? Marital status: Divorced  ?  Spouse name: Not on file  ? Number of children: Not on file  ? Years of education: Not on file  ? Highest education level: Not on file  ?Occupational History  ? Not on file  ?Tobacco Use  ? Smoking status: Some Days  ?  Years: 10.00  ?  Types: Cigarettes  ? Smokeless tobacco: Never  ? Tobacco comments:  ?  Smokes a few times a week, ususally with friends, planning on stopping, resources provided  ?Vaping Use  ? Vaping Use: Never used  ?Substance and Sexual Activity  ? Alcohol use: Yes  ?  Alcohol/week: 3.0 - 4.0 standard drinks  ?  Types: 3 - 4 Cans of beer per week  ? Drug use: Yes  ?  Frequency: 7.0 times per week  ?  Types: Marijuana  ? Sexual activity: Not on file  ?Other Topics Concern  ? Not on file  ?  Social History Narrative  ? Not on file  ? ?Social Determinants of Health  ? ?Financial Resource Strain: Not on file  ?Food Insecurity: Not on file  ?Transportation Needs: Not on file  ?Physical Activity: Not on file  ?Stress: Not on file  ?Social Connections: Not on file  ? ? ?Allergies: No Known Allergies ? ?Metabolic Disorder Labs: ?No results found for: HGBA1C, MPG ?No results found for: PROLACTIN ?No results found for: CHOL, TRIG, HDL, CHOLHDL, VLDL, LDLCALC ?No results found for: TSH ? ?Therapeutic  Level Labs: ?No results found for: LITHIUM ?No results found for: VALPROATE ?No components found for:  CBMZ ? ?Current Medications: ?Current Outpatient Medications  ?Medication Sig Dispense Refill  ? buPROPion (WELLBUTRIN SR) 150 MG 12 hr tablet Take 1 tablet (150 mg total) by mouth daily. 30 tablet 1  ? escitalopram (LEXAPRO) 5 MG tablet Take 1 tablet (5 mg total) by mouth daily. 30 tablet 1  ? HYDROcodone-acetaminophen (NORCO) 5-325 MG tablet Take 1 tablet by mouth every 4 (four) hours as needed for moderate pain. 12 tablet 0  ? ?No current facility-administered medications for this visit.  ? ? ? ?Musculoskeletal: ?Strength & Muscle Tone: N/A virtual visit ?Gait & Station: N/A ?Patient leans: N/A ? ?Psychiatric Specialty Exam: ?Review of Systems  ?Psychiatric/Behavioral:  Negative for hallucinations, self-injury and suicidal ideas.   ?All other systems reviewed and are negative.  ?There were no vitals taken for this visit.There is no height or weight on file to calculate BMI.  ?General Appearance: NA  ?Eye Contact:  NA  ?Speech:  Clear and Coherent  ?Volume:  Normal  ?Mood:  Euthymic  ?Affect:  NA  ?Thought Process:  Coherent  ?Orientation:  Full (Time, Place, and Person)  ?Thought Content: Logical   ?Suicidal Thoughts:  No  ?Homicidal Thoughts:  No  ?Memory: Good  ?Judgement:  Good  ?Insight:  Good  ?Psychomotor Activity:  NA  ?Concentration: Good  ?Recall:  Good  ?Fund of Knowledge: Good  ?Language: Good  ?Akathisia:  NA  ?Handed:  Right  ?AIMS (if indicated): not done  ?Assets:  Communication Skills  ?ADL's:  Intact  ?Cognition: WNL  ?Sleep:  Good  ? ?Screenings: ?GAD-7   ? ?Flowsheet Row Counselor from 04/16/2021 in [facility] Counselor from 03/02/2021 in [facility] Counselor from 01/15/2021 in [facility] Counselor from 11/20/2020 in [facility]  ?Total GAD-7 Score 3 7 14 17   ? ?  ? ?PHQ2-9    ? ?Flowsheet Row Counselor from 04/16/2021 in [facility] Counselor from 03/02/2021 in [facility] Counselor from 01/15/2021 in [facility] Counselor from 11/20/2020 in [facility] Video Visit from 04/21/2020 in [facility]  ?PHQ-2 Total Score 0 1 5 2 1   ?PHQ-9 Total Score 4 5 13 12 2   ? ?  ? ?Flowsheet Row Pre-Admission Testing 60 from 01/19/2021 in [facility] Counselor from 11/20/2020 in [facility] Video Visit from 04/21/2020 in [facility]  ?C-SSRS RISK CATEGORY No Risk Low Risk No Risk  ? ?  ? ? ? ?Assessment and Plan: Marcus Sherman is a 36 year old male presenting to [facility] behavioral health outpatient for follow-up psychiatric evaluation.  Patient has a psychiatric history of generalized anxiety disorder and major depressive disorder.  Patient symptoms are managed with Lexapro 10 mg  daily.  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  Patient is open to decreasing Lexapro and initiating Wellbutrin today.  Medication benefits versus risk discussed. Wellbutrin SR 150 mg daily ordered and Lexapro decreased to 5 mg daily. Patient informed to notify provider if depressive symptoms worsen. ? ? ? ? ?Collaboration of Care: Collaboration of Care: Medication Management AEB medications E scribed to patient's preferred pharmacy ? ?1. Mild episode of recurrent major depressive disorder (HCC) ?- escitalopram (LEXAPRO) 5 MG tablet; Take 1 tablet (5 mg total) by mouth daily.  Dispense: 30 tablet; Refill: 1 ?- buPROPion (WELLBUTRIN SR) 150 MG 12 hr tablet; Take 1 tablet (150 mg total) by mouth daily.  Dispense: 30 tablet; Refill: 1  ? ?2. Generalized anxiety disorder ?- escitalopram  (LEXAPRO) 5 MG tablet; Take 1 tablet (5 mg total) by mouth daily.  Dispense: 30 tablet; Refill: 1 ? ? ? ?Other orders ?Return to care in 6 weeks ? ? ?Patient/Guardian was advised Release of Information must be

## 2021-08-07 ENCOUNTER — Other Ambulatory Visit (HOSPITAL_COMMUNITY): Payer: Self-pay | Admitting: Psychiatry

## 2021-08-07 ENCOUNTER — Telehealth (HOSPITAL_COMMUNITY): Payer: Self-pay | Admitting: *Deleted

## 2021-08-07 MED ORDER — ESCITALOPRAM OXALATE 5 MG PO TABS: 5.0000 mg | ORAL_TABLET | Freq: Every day | ORAL | 3 refills | Status: DC

## 2021-08-07 MED ORDER — BUPROPION HCL ER (SR) 150 MG PO TB12: 150.0000 mg | ORAL_TABLET | Freq: Every day | ORAL | 3 refills | Status: DC

## 2021-08-10 ENCOUNTER — Telehealth (HOSPITAL_COMMUNITY): Payer: Commercial Managed Care - HMO | Admitting: Psychiatry

## 2021-08-10 ENCOUNTER — Encounter (HOSPITAL_COMMUNITY): Payer: Self-pay

## 2021-10-05 ENCOUNTER — Other Ambulatory Visit (HOSPITAL_COMMUNITY): Payer: Self-pay

## 2021-10-05 ENCOUNTER — Other Ambulatory Visit (HOSPITAL_COMMUNITY): Payer: Self-pay | Admitting: Psychiatry

## 2021-10-05 ENCOUNTER — Telehealth (HOSPITAL_COMMUNITY): Payer: Self-pay

## 2021-10-05 MED ORDER — ESCITALOPRAM OXALATE 5 MG PO TABS: 5.0000 mg | ORAL_TABLET | Freq: Every day | ORAL | 3 refills | Status: DC

## 2021-10-05 MED ORDER — BUPROPION HCL ER (SR) 150 MG PO TB12: 150.0000 mg | ORAL_TABLET | Freq: Every day | ORAL | 3 refills | Status: DC

## 2021-10-05 NOTE — Telephone Encounter (Signed)
90 day refill.

## 2021-10-05 NOTE — Telephone Encounter (Signed)
Medication refilled and sent to preferred pharmacy. Patient will need to schedule follow up visit for future refills.

## 2022-01-01 ENCOUNTER — Other Ambulatory Visit (HOSPITAL_COMMUNITY): Payer: Self-pay | Admitting: Psychiatry

## 2022-01-19 ENCOUNTER — Telehealth (HOSPITAL_COMMUNITY): Payer: Self-pay

## 2022-01-19 NOTE — Telephone Encounter (Signed)
Patient has not been seen in over 6 month. Provider informed patient that he would need to be seen in order for medications to be continued. Patient noted that it was difficult for him to get off from work. Patient has also missed his last two appointments. He reports that he will attempt to take time off and walk in to the clinic. No other concerns noted at this time.

## 2022-05-27 ENCOUNTER — Telehealth (HOSPITAL_COMMUNITY): Payer: Self-pay | Admitting: *Deleted

## 2022-05-27 NOTE — Telephone Encounter (Signed)
[facility] faxed a rx request for patients escitalopram. He has not been seen since May of 2023 so it has been too long since seeing a provider to get a rx without being seen. I will forward a request to the front desk staff to call him to schedule but no meds till he is seen.

## 2022-05-28 ENCOUNTER — Telehealth (INDEPENDENT_AMBULATORY_CARE_PROVIDER_SITE_OTHER): Payer: Commercial Managed Care - HMO | Admitting: Psychiatry

## 2022-05-28 ENCOUNTER — Encounter (HOSPITAL_COMMUNITY): Payer: Self-pay | Admitting: Psychiatry

## 2022-05-28 DIAGNOSIS — F330 Major depressive disorder, recurrent, mild: Secondary | ICD-10-CM

## 2022-05-28 DIAGNOSIS — F411 Generalized anxiety disorder: Secondary | ICD-10-CM

## 2022-05-28 MED ORDER — BUPROPION HCL ER (SR) 150 MG PO TB12: 150.0000 mg | ORAL_TABLET | Freq: Every day | ORAL | 3 refills | Status: DC

## 2022-05-28 MED ORDER — ESCITALOPRAM OXALATE 5 MG PO TABS: 5.0000 mg | ORAL_TABLET | Freq: Every day | ORAL | 3 refills | Status: DC

## 2022-05-28 NOTE — Progress Notes (Signed)
Psychiatric Initial Adult Assessment  Virtual Visit via Video Note  I connected with Marcus Sherman on 05/28/22 at 10:30 AM EDT by a video enabled telemedicine application and verified that I am speaking with the correct person using two identifiers.  Location: Patient: Home Provider: Clinic   I discussed the limitations of evaluation and management by telemedicine and the availability of in person appointments. The patient expressed understanding and agreed to proceed.  I provided 40 minutes of non-face-to-face time during this encounter.   Patient Identification: Marcus Sherman MRN:  [id number] Date of Evaluation:  05/28/2022 Referral Source: Reestablishing care Chief Complaint:  " I am doing pretty good" Visit Diagnosis:    ICD-10-CM   1. Mild episode of recurrent major depressive disorder  F33.0 buPROPion (WELLBUTRIN SR) 150 MG 12 hr tablet    escitalopram (LEXAPRO) 5 MG tablet    2. Generalized anxiety disorder  F41.1 escitalopram (LEXAPRO) 5 MG tablet      History of Present Illness: 37 year old male seen today for follow-up psychiatric evaluation.  Patient has not been seen in the clinic for over a year and would like to reestablish care.  Has a psychiatric history of depression, anxiety, and tobacco use, in remission for years).  Currently he is managed on Wellbutrin SR 150 mg daily and Lexapro 5 mg daily.  He reports his medications are effective in managing his psychiatric condition.  Today he is well-groomed, pleasant, cooperative, and engaged in conversation.  He informed Clinical research associate that he is doing pretty well.  He reports that he is busy working today.  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  He informed Clinical research associate that he finds enjoyment in his job.  Patient reports that his anxiety and depression are well-managed.  Provider conducted GAD-7 and patient scored a 6.  Provider also conducted PHQ-9 and patient scored a 5.  He endorses adequate sleep  and appetite.  Today denies SI/HI/AVH, mania, paranoia.  No medication changes made today.  Patient agreeable to medications of prescribed.  No other concerns at this time.  Associated Signs/Symptoms: Depression Symptoms:  depressed mood, fatigue, difficulty concentrating, anxiety, (Hypo) Manic Symptoms:  Elevated Mood, Flight of Ideas, Irritable Mood, Anxiety Symptoms:   Denies Psychotic Symptoms:   Denies PTSD Symptoms: NA  Past Psychiatric History: Anxiety, depression, tobacco use (in remission for years)  Previous Psychotropic Medications:  Wellbutrin, Lexapro  Substance Abuse History in the last 12 months:  No.  Consequences of Substance Abuse: NA  Past Medical History:  Past Medical History:  Diagnosis Date   Anxiety     Past Surgical History:  Procedure Laterality Date   NO PAST SURGERIES     ORCHIOPEXY Bilateral 01/26/2021   Procedure: RIGHT INGUINAL ORCHIOPEXY ADULT BILATERAL VASECTOMY;  Surgeon: Crista Elliot, MD;  Location: [facility];  Service: Urology;  Laterality: Bilateral;    Family Psychiatric History: Mom depression, maternal grandfather bipolar, sister bipolar  Family History: History reviewed. No pertinent family history.  Social History:   Social History   Socioeconomic History   Marital status: Divorced    Spouse name: Not on file   Number of children: Not on file   Years of education: Not on file   Highest education level: Not on file  Occupational History   Not on file  Tobacco Use   Smoking status: Some Days    Years: 10    Types: Cigarettes   Smokeless tobacco: Never   Tobacco comments:  Smokes a few times a week, ususally with friends, planning on stopping, resources provided  Vaping Use   Vaping Use: Never used  Substance and Sexual Activity   Alcohol use: Yes    Alcohol/week: 3.0 - 4.0 standard drinks of alcohol    Types: 3 - 4 Cans of beer per week   Drug use: Yes    Frequency: 7.0 times per week    Types: Marijuana    Sexual activity: Not on file  Other Topics Concern   Not on file  Social History Narrative   Not on file   Social Determinants of Health   Financial Resource Strain: Not on file  Food Insecurity: Not on file  Transportation Needs: Not on file  Physical Activity: Not on file  Stress: Not on file  Social Connections: Not on file    Additional Social History: Patient resides in [place] with his girlfriend. He is has no children. He works at [employer]. He notes that he drink socially. He denies tobacco use (remission for a year). He denies illegal substance use.   Allergies:  No Known Allergies  Metabolic Disorder Labs: No results found for: "HGBA1C", "MPG" No results found for: "PROLACTIN" No results found for: "CHOL", "TRIG", "HDL", "CHOLHDL", "VLDL", "LDLCALC" No results found for: "TSH"  Therapeutic Level Labs: No results found for: "LITHIUM" No results found for: "CBMZ" No results found for: "VALPROATE"  Current Medications: Current Outpatient Medications  Medication Sig Dispense Refill   buPROPion (WELLBUTRIN SR) 150 MG 12 hr tablet Take 1 tablet (150 mg total) by mouth daily. 30 tablet 3   escitalopram (LEXAPRO) 5 MG tablet Take 1 tablet (5 mg total) by mouth daily. 30 tablet 3   HYDROcodone-acetaminophen (NORCO) 5-325 MG tablet Take 1 tablet by mouth every 4 (four) hours as needed for moderate pain. 12 tablet 0   No current facility-administered medications for this visit.    Musculoskeletal: Strength & Muscle Tone: within normal limits and telehealth visit Gait & Station: normal, telehealth visit Patient leans: N/A  Psychiatric Specialty Exam: Review of Systems  There were no vitals taken for this visit.There is no height or weight on file to calculate BMI.  General Appearance: Well Groomed  Eye Contact:  Good  Speech:  Clear and Coherent and Normal Rate  Volume:  Normal  Mood:  Euthymic  Affect:  Appropriate and Congruent  Thought Process:  Coherent,  Goal Directed, and Linear  Orientation:  Full (Time, Place, and Person)  Thought Content:  WDL and Logical  Suicidal Thoughts:  No  Homicidal Thoughts:  No  Memory:  Immediate;   Good Recent;   Good Remote;   Good  Judgement:  Good  Insight:  Good  Psychomotor Activity:  Normal  Concentration:  Concentration: Good and Attention Span: Good  Recall:  Good  Fund of Knowledge:Good  Language: Good  Akathisia:  No  Handed:  Right  AIMS (if indicated):  not done  Assets:  Communication Skills Desire for Improvement Financial Resources/Insurance Housing Intimacy Leisure Time Physical Health Social Support Vocational/Educational  ADL's:  Intact  Cognition: WNL  Sleep:  Good   Screenings: GAD-7    Flowsheet Row Video Visit from 05/28/2022 in [facility] Counselor from 04/16/2021 in [facility] Counselor from 03/02/2021 in [facility] Counselor from 01/15/2021 in [facility] Counselor from 11/20/2020 in [facility]  Total GAD-7 Score 17  [id number]    Flowsheet Row Video Visit from 05/28/2022 in [facility] Counselor from 04/16/2021 in [facility] Counselor from 03/02/2021 in [facility] Counselor from 01/15/2021 in [facility] Counselor from 11/20/2020 in [facility]  PHQ-2 Total Score 2 0 PHQ-9 Total Score Flowsheet Row Pre-Admission Testing 60 from 01/19/2021 in [facility] Counselor from 11/20/2020 in [facility] Video Visit from 04/21/2020 in [facility]  C-SSRS RISK CATEGORY No Risk Low Risk No Risk       Assessment and Plan: Patient notes that he is doing well on his  current medication regimen.  No medication changes made today.  Patient agreeable to continue medications as prescribed.  1. Mild episode of recurrent major depressive disorder  Continue- buPROPion (WELLBUTRIN SR) 150 MG 12 hr tablet; Take 1 tablet (150 mg total) by mouth daily.  Dispense: 30 tablet; Refill: 3 Continue- escitalopram (LEXAPRO) 5 MG tablet; Take 1 tablet (5 mg total) by mouth daily.  Dispense: 30 tablet; Refill: 3  2. Generalized anxiety disorder  Continue- escitalopram (LEXAPRO) 5 MG tablet; Take 1 tablet (5 mg total) by mouth daily.  Dispense: 30 tablet; Refill: 3    Collaboration of Care: Other provider involved in patient's care AEB PCP  Patient/Guardian was advised Release of Information must be obtained prior to any record release in order to collaborate their care with an outside provider. Patient/Guardian was advised if they have not already done so to contact the registration department to sign all necessary forms in order for [place] to release information regarding their care.   Consent: Patient/Guardian gives verbal consent for treatment and assignment of benefits for services provided during this visit. Patient/Guardian expressed understanding and agreed to proceed.   Follow-up in 2 months  Shanna Cisco, NP 4/12/202410:33 AM

## 2022-06-01 ENCOUNTER — Telehealth (HOSPITAL_COMMUNITY): Payer: Self-pay | Admitting: *Deleted

## 2022-06-01 ENCOUNTER — Other Ambulatory Visit (HOSPITAL_COMMUNITY): Payer: Self-pay | Admitting: Psychiatry

## 2022-06-01 DIAGNOSIS — F330 Major depressive disorder, recurrent, mild: Secondary | ICD-10-CM

## 2022-06-01 DIAGNOSIS — F411 Generalized anxiety disorder: Secondary | ICD-10-CM

## 2022-06-01 MED ORDER — ESCITALOPRAM OXALATE 5 MG PO TABS: 5.0000 mg | ORAL_TABLET | Freq: Every day | ORAL | 3 refills | Status: DC

## 2022-06-01 NOTE — Telephone Encounter (Signed)
escitalopram (LEXAPRO) 5 MG tablet  Take 1 tablet (5 mg total) by mouth daily. Dispense: 30 tablet,  Refills: 3 ordered   [id number] - [place], [place] - [address]

## 2022-06-01 NOTE — Telephone Encounter (Signed)
Medication refilled and sent to preferred pharmacy

## 2022-06-17 ENCOUNTER — Telehealth (HOSPITAL_COMMUNITY): Payer: Self-pay | Admitting: *Deleted

## 2022-06-17 ENCOUNTER — Other Ambulatory Visit (HOSPITAL_COMMUNITY): Payer: Self-pay | Admitting: Psychiatry

## 2022-06-17 DIAGNOSIS — F411 Generalized anxiety disorder: Secondary | ICD-10-CM

## 2022-06-17 DIAGNOSIS — F330 Major depressive disorder, recurrent, mild: Secondary | ICD-10-CM

## 2022-06-17 MED ORDER — ESCITALOPRAM OXALATE 5 MG PO TABS: 5.0000 mg | ORAL_TABLET | Freq: Every day | ORAL | 3 refills | Status: DC

## 2022-06-17 MED ORDER — BUPROPION HCL ER (SR) 150 MG PO TB12: 150.0000 mg | ORAL_TABLET | Freq: Every day | ORAL | 3 refills | Status: DC

## 2022-06-17 NOTE — Telephone Encounter (Signed)
Fax from [facility] requesting a 90 day supply of his Bupropion and Escitalopram. I will forward this request to the provider. He should be out of meds on 5/12 but the current rx has 3 refills. Forward to [employer] DNP.

## 2022-07-30 ENCOUNTER — Encounter (HOSPITAL_COMMUNITY): Payer: Self-pay | Admitting: Psychiatry

## 2022-07-30 ENCOUNTER — Telehealth (INDEPENDENT_AMBULATORY_CARE_PROVIDER_SITE_OTHER): Payer: Commercial Managed Care - HMO | Admitting: Psychiatry

## 2022-07-30 DIAGNOSIS — F411 Generalized anxiety disorder: Secondary | ICD-10-CM

## 2022-07-30 DIAGNOSIS — F330 Major depressive disorder, recurrent, mild: Secondary | ICD-10-CM

## 2022-07-30 MED ORDER — HYDROXYZINE HCL 10 MG PO TABS: 10.0000 mg | ORAL_TABLET | Freq: Three times a day (TID) | ORAL | 3 refills | Status: DC | PRN

## 2022-07-30 MED ORDER — BUPROPION HCL ER (SR) 150 MG PO TB12: 150.0000 mg | ORAL_TABLET | Freq: Every day | ORAL | 3 refills | Status: DC

## 2022-07-30 NOTE — Progress Notes (Signed)
BH MD/PA/NP OP Progress Note Virtual Visit via Video Note  I connected with Marcus Sherman on 07/30/22 at 10:30 AM EDT by a video enabled telemedicine application and verified that I am speaking with the correct person using two identifiers.  Location: Patient: Work Provider: Clinic   I discussed the limitations of evaluation and management by telemedicine and the availability of in person appointments. The patient expressed understanding and agreed to proceed.  I provided 30 minutes of non-face-to-face time during this encounter.   07/30/2022 9:23 AM Marcus Sherman  MRN:  [id number]  Chief Complaint: " Currently clenching my jaw"  HPI: 37 year old-year-old male seen today for follow-up psychiatric evaluation.  He has a psychiatric history of tobacco dependence (in remission), anxiety, and depression.  Currently he is managed on Wellbutrin SR 150 mg daily and Lexapro 5 mg daily.  He reports his medications are effective for managing his psychiatric conditions but notes that he may want to discontinue Lexapro.  Today he was well-groomed, pleasant, cooperative, and engaged in conversation.  He informed Clinical research associate that he has been clenching his jaw and attributes it to Lexapro.  Provider informed patient that Lexapro generally does not cause clenched jaws.  He informed Clinical research associate that his girlfriend experienced something similar.  He has providers recommendation.  Provider informed patient that he is on low-dose of Lexapro and if he would like to discontinue it she would be agreeable to it.  He endorsed understanding and notes that he would like to discontinue it.  Patient informed Clinical research associate that recently he has been more anxious about work and finances.  He notes that he combats his anxiety with positive thinking.  Today provider conducted GAD-7 and patient scored a 9, at his last visit he scored a 6.  Provider also conducted PHQ-9 and patient scored a 3, at his last visit he scored a 5.  He endorses adequate  sleep and appetite.  Today he denies SI/HI/VAH, mania, paranoia.  Today Lexapro 5 mg discontinued. He will start hydroxyzine 10 mg 3 times daily as needed to help manage anxiety.  Potential side effects of medication and risks vs benefits of treatment vs non-treatment were explained and discussed. All questions were answered. He will continue Wellbutrin as prescribed.  No other concerns at this time. Visit Diagnosis:    ICD-10-CM   1. Generalized anxiety disorder  F41.1 hydrOXYzine (ATARAX) 10 MG tablet    2. Mild episode of recurrent major depressive disorder (HCC)  F33.0 buPROPion (WELLBUTRIN SR) 150 MG 12 hr tablet      Past Psychiatric History: Tobacco use (in remission), anxiety and depression  Past Medical History:  Past Medical History:  Diagnosis Date   Anxiety     Past Surgical History:  Procedure Laterality Date   NO PAST SURGERIES     ORCHIOPEXY Bilateral 01/26/2021   Procedure: RIGHT INGUINAL ORCHIOPEXY ADULT BILATERAL VASECTOMY;  Surgeon: Crista Elliot, MD;  Location: [facility];  Service: Urology;  Laterality: Bilateral;    Family Psychiatric History: Mom depression, maternal grandfather bipolar, sister bipolar   Family History: History reviewed. No pertinent family history.  Social History:  Social History   Socioeconomic History   Marital status: Divorced    Spouse name: Not on file   Number of children: Not on file   Years of education: Not on file   Highest education level: Not on file  Occupational History   Not on file  Tobacco Use   Smoking status: Some Days    Years:  10    Types: Cigarettes   Smokeless tobacco: Never   Tobacco comments:    Smokes a few times a week, ususally with friends, planning on stopping, resources provided  Vaping Use   Vaping Use: Never used  Substance and Sexual Activity   Alcohol use: Yes    Alcohol/week: 3.0 - 4.0 standard drinks of alcohol    Types: 3 - 4 Cans of beer per week   Drug use: Yes    Frequency: 7.0  times per week    Types: Marijuana   Sexual activity: Not on file  Other Topics Concern   Not on file  Social History Narrative   Not on file   Social Determinants of Health   Financial Resource Strain: Not on file  Food Insecurity: Not on file  Transportation Needs: Not on file  Physical Activity: Not on file  Stress: Not on file  Social Connections: Not on file    Allergies: No Known Allergies  Metabolic Disorder Labs: No results found for: "HGBA1C", "MPG" No results found for: "PROLACTIN" No results found for: "CHOL", "TRIG", "HDL", "CHOLHDL", "VLDL", "LDLCALC" No results found for: "TSH"  Therapeutic Level Labs: No results found for: "LITHIUM" No results found for: "VALPROATE" No results found for: "CBMZ"  Current Medications: Current Outpatient Medications  Medication Sig Dispense Refill   hydrOXYzine (ATARAX) 10 MG tablet Take 1 tablet (10 mg total) by mouth 3 (three) times daily as needed. 90 tablet 3   buPROPion (WELLBUTRIN SR) 150 MG 12 hr tablet Take 1 tablet (150 mg total) by mouth daily. 30 tablet 3   HYDROcodone-acetaminophen (NORCO) 5-325 MG tablet Take 1 tablet by mouth every 4 (four) hours as needed for moderate pain. 12 tablet 0   No current facility-administered medications for this visit.     Musculoskeletal: Strength & Muscle Tone: within normal limits and telehealth visit Gait & Station: normal, telehealth visit Patient leans: N/A  Psychiatric Specialty Exam: Review of Systems  There were no vitals taken for this visit.There is no height or weight on file to calculate BMI.  General Appearance: Well Groomed  Eye Contact:  Good  Speech:  Clear and Coherent and Normal Rate  Volume:  Normal  Mood:  Anxious  Affect:  Appropriate and Congruent  Thought Process:  Coherent, Goal Directed, and Linear  Orientation:  Full (Time, Place, and Person)  Thought Content: WDL and Logical   Suicidal Thoughts:  No  Homicidal Thoughts:  No  Memory:   Immediate;   Good Recent;   Good Remote;   Good  Judgement:  Good  Insight:  Good  Psychomotor Activity:  Normal  Concentration:  Concentration: Good and Attention Span: Good  Recall:  Good  Fund of Knowledge: Good  Language: Good  Akathisia:  No  Handed:  Right  AIMS (if indicated): not done  Assets:  Communication Skills Desire for Improvement Financial Resources/Insurance Housing Intimacy Physical Health Resilience Social Support Talents/Skills  ADL's:  Intact  Cognition: WNL  Sleep:  Good   Screenings: GAD-7    Flowsheet Row Video Visit from 07/30/2022 in [facility] Video Visit from 05/28/2022 in [facility] Counselor from 04/16/2021 in [facility] Counselor from 03/02/2021 in [facility] Counselor from 01/15/2021 in [facility]  Total GAD-7 Score 9 6 3 7 14       PHQ2-9    Flowsheet Row Video Visit from 07/30/2022 in [facility] Video Visit from 05/28/2022 in [facility] Counselor from 04/16/2021 in [facility] Counselor from 03/02/2021 in [facility] Counselor from 01/15/2021 in [facility]  PHQ-2 Total Score 2 2 0 1 5  PHQ-9 Total Score 3 5 4 5 13       Flowsheet Row Pre-Admission Testing 60 from 01/19/2021 in [facility] Counselor from 11/20/2020 in [facility] Video Visit from 04/21/2020 in [facility]  C-SSRS RISK CATEGORY No Risk Low Risk No Risk        Assessment and Plan: Today patient endorses mild anxiety.  He notes that he would like to discontinue Lexapro.Today Lexapro 5 mg discontinued. He will start hydroxyzine 10 mg 3 times daily as needed to help manage anxiety.  He will continue  Wellbutrin as prescribed.  1. Mild episode of recurrent major depressive disorder (HCC)  Continue- buPROPion (WELLBUTRIN SR) 150 MG 12 hr tablet; Take 1 tablet (150 mg total) by mouth daily.  Dispense: 30 tablet; Refill: 3  2. Generalized anxiety disorder  Start- hydrOXYzine (ATARAX) 10 MG tablet; Take 1 tablet (10 mg total) by mouth 3 (three) times daily as needed.  Dispense: 90 tablet; Refill: 3   Collaboration of Care: Collaboration of Care: Other provider involved in patient's care AEB PCP  Patient/Guardian was advised Release of Information must be obtained prior to any record release in order to collaborate their care with an outside provider. Patient/Guardian was advised if they have not already done so to contact the registration department to sign all necessary forms in order for [place] to release information regarding their care.   Consent: Patient/Guardian gives verbal consent for treatment and assignment of benefits for services provided during this visit. Patient/Guardian expressed understanding and agreed to proceed.   Follow-up in 3 months Shanna Cisco, NP 07/30/2022, 9:23 AM

## 2022-09-27 IMAGING — US US SCROTUM W/ DOPPLER COMPLETE
1 series · 14 of 25 positions shown · non-contrast
Comparison: None.

CLINICAL DATA: Absence and aplasia of testis

EXAM:
SCROTAL ULTRASOUND
DOPPLER ULTRASOUND OF THE TESTICLES
TECHNIQUE: Complete ultrasound examination of the testicles, epididymis, and
other scrotal structures was performed. Color and spectral Doppler
ultrasound were also utilized to evaluate blood flow to the
testicles.

[Series 1: us scrotum w/ doppler complete · 0.06mm/px · 14 of 52 slices shown]
[im 1/52]
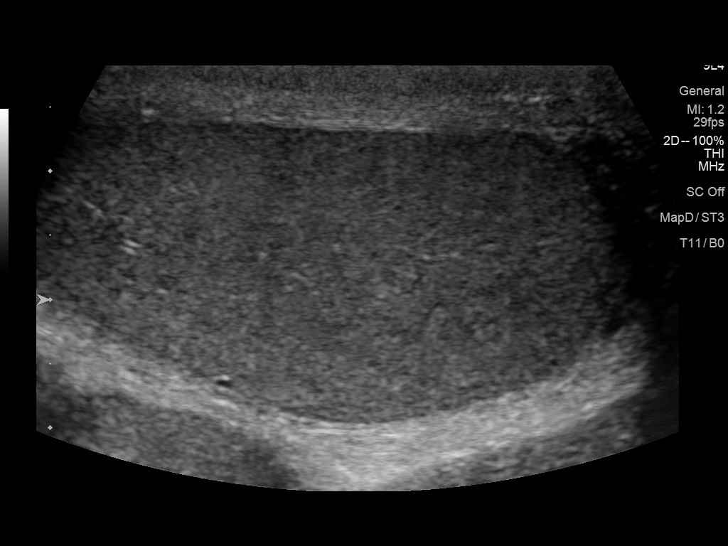
[im 5/52]
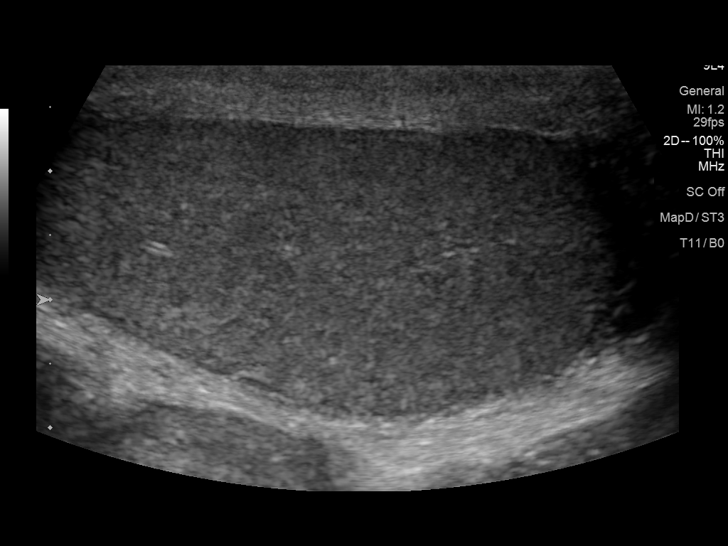
[im 9/52]
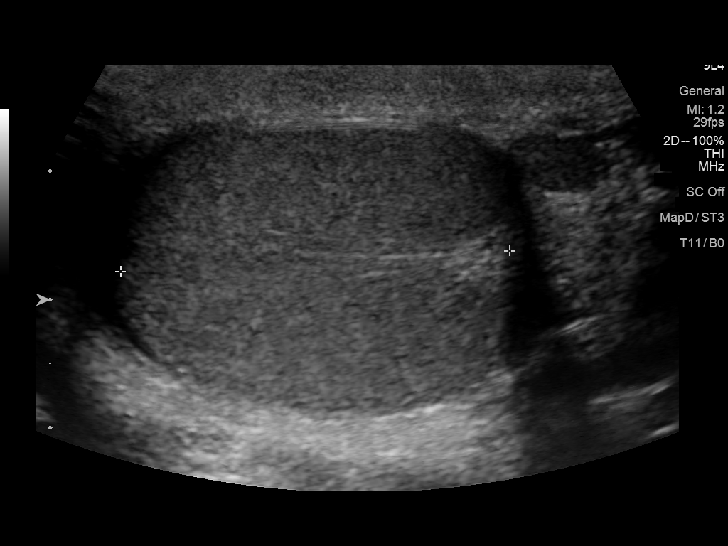
[im 13/52]
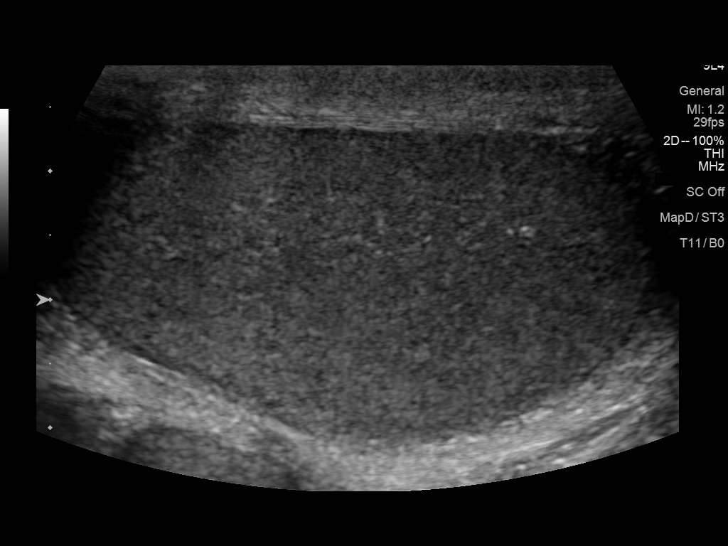
[im 18/52]
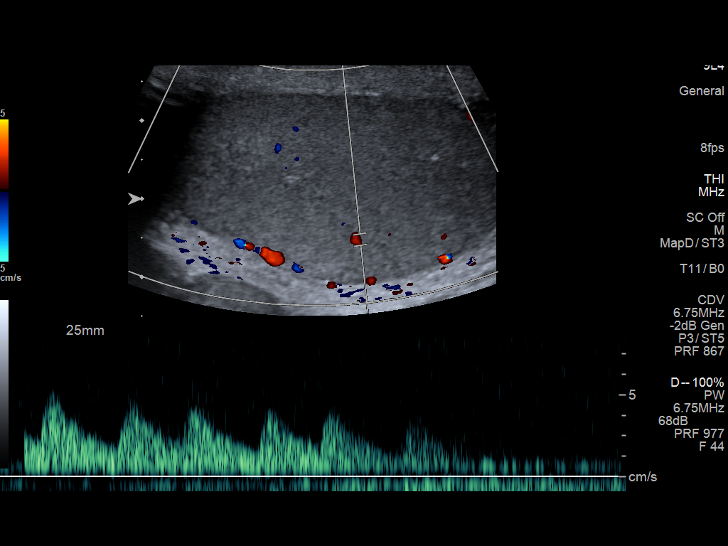
[im 20/52]
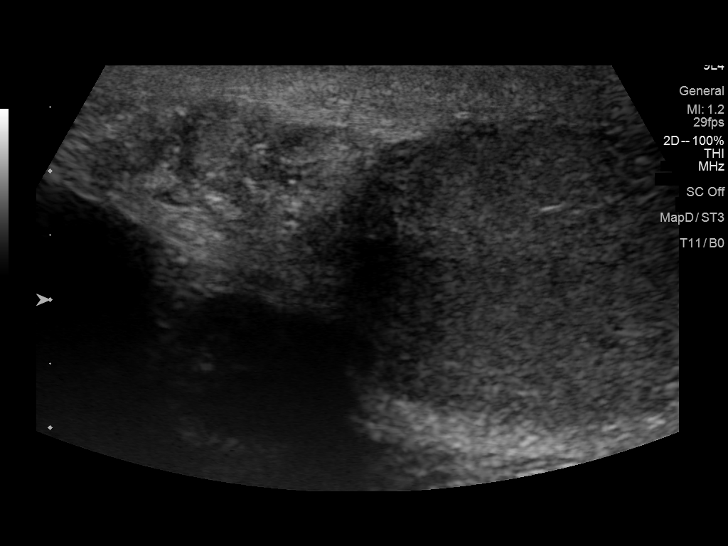
[im 24/52]
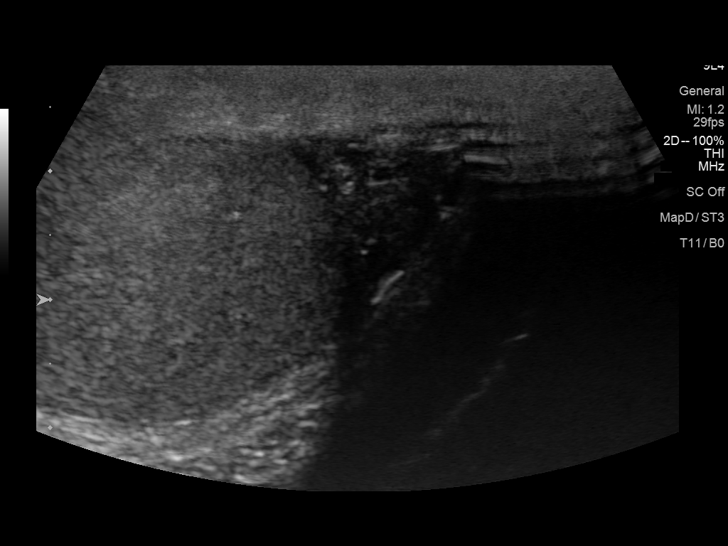
[im 28/52]
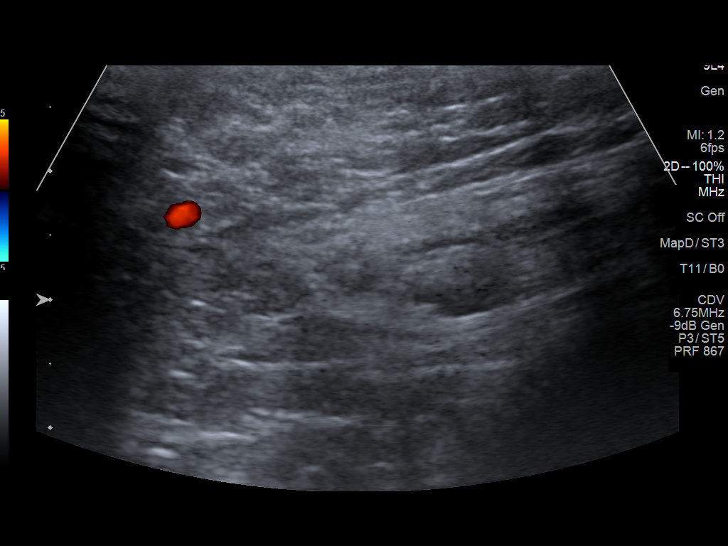
[im 32/52]
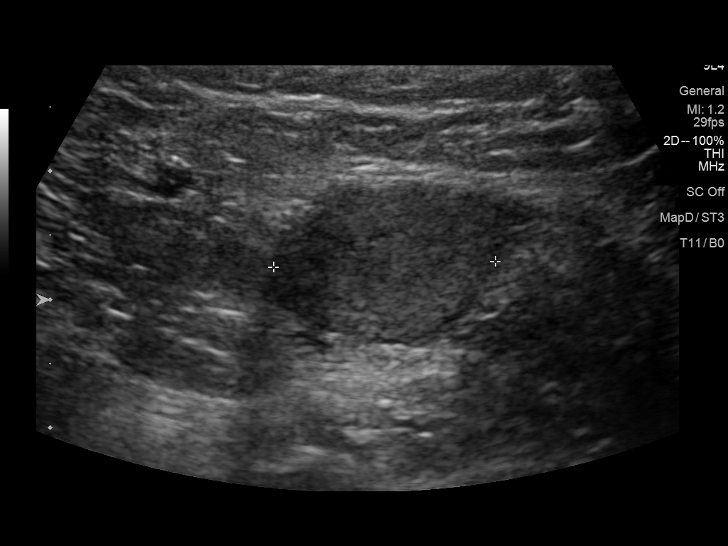
[im 35/52]
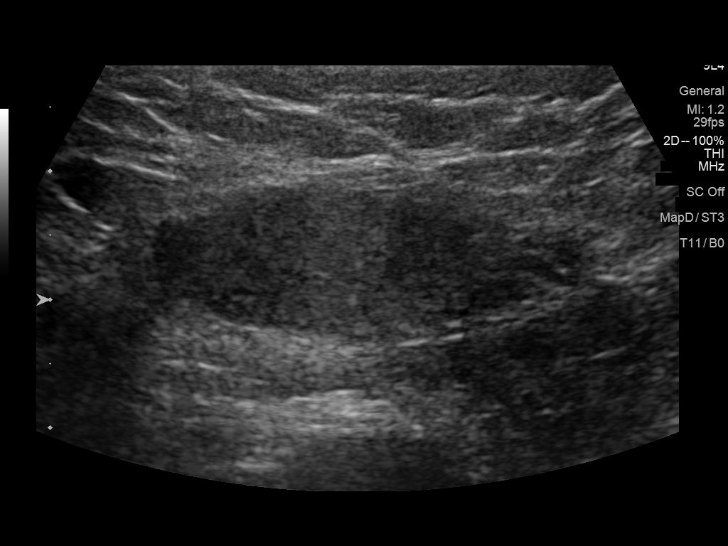
[im 39/52]
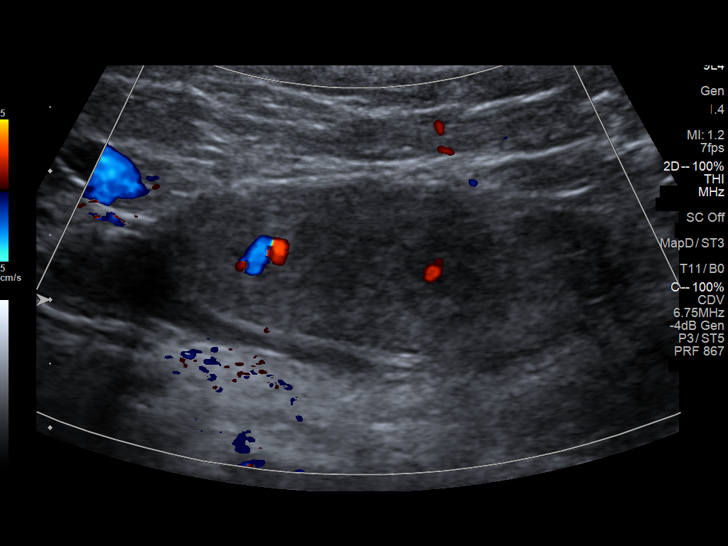
[im 43/52]
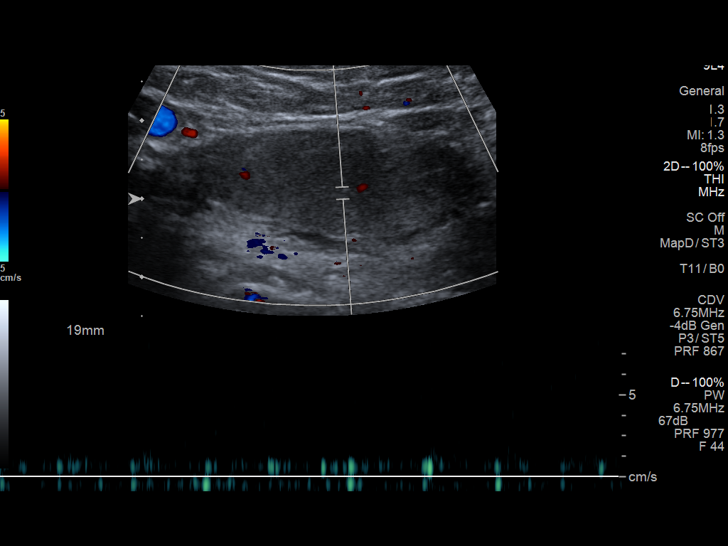
[im 47/52]
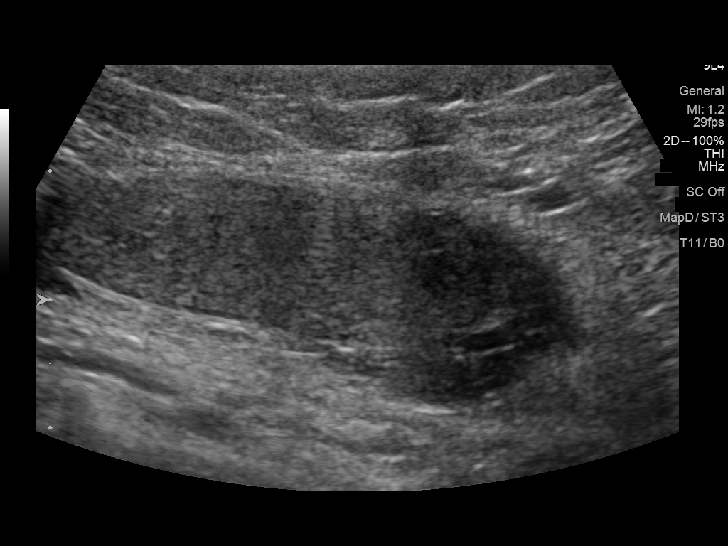
[im 52/52]
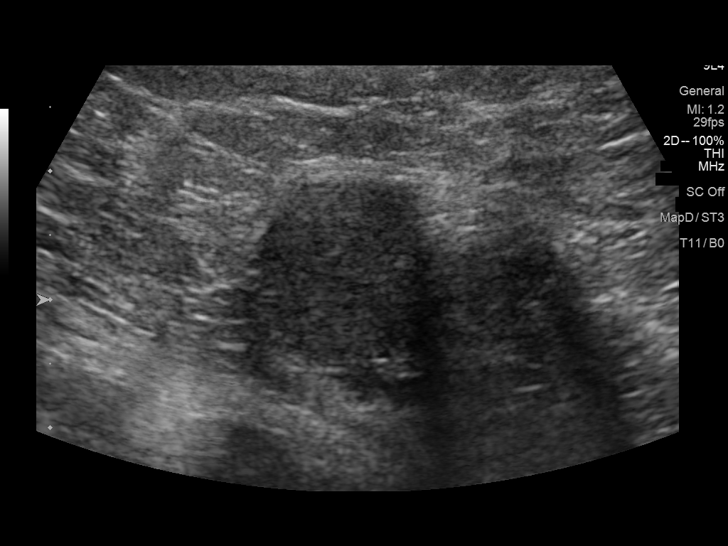

[14 of 25 positions shown; findings below may reference images not displayed]

FINDINGS: Right testicle

Measurements: 3.6 x 1.3 x 1.7 cm. The right testicle is located
within the inguinal canal.

Left testicle

Measurements: 4.4 x 2.3 x 3.0 cm. No mass or microlithiasis
visualized.

Right epididymis:  Normal in size and appearance.

Left epididymis:  Normal in size and appearance.

Hydrocele:  None visualized.

Varicocele:  None visualized.

Pulsed Doppler interrogation of both testes demonstrates normal low
resistance arterial and venous waveforms bilaterally.
IMPRESSION: The right testicle is located in the right inguinal canal, which is
otherwise normal appearing with normal Doppler flow.

Normal left testicle located in the scrotum.

## 2022-10-22 ENCOUNTER — Telehealth (INDEPENDENT_AMBULATORY_CARE_PROVIDER_SITE_OTHER): Payer: Commercial Managed Care - HMO | Admitting: Psychiatry

## 2022-10-22 ENCOUNTER — Encounter (HOSPITAL_COMMUNITY): Payer: Self-pay | Admitting: Psychiatry

## 2022-10-22 DIAGNOSIS — F330 Major depressive disorder, recurrent, mild: Secondary | ICD-10-CM

## 2022-10-22 DIAGNOSIS — F419 Anxiety disorder, unspecified: Secondary | ICD-10-CM | POA: Diagnosis not present

## 2022-10-22 MED ORDER — BUPROPION HCL ER (SR) 150 MG PO TB12: 150.0000 mg | ORAL_TABLET | Freq: Every day | ORAL | 3 refills | Status: DC

## 2022-10-22 MED ORDER — BUSPIRONE HCL 10 MG PO TABS: 10.0000 mg | ORAL_TABLET | Freq: Three times a day (TID) | ORAL | 3 refills | Status: DC

## 2022-10-22 NOTE — Progress Notes (Signed)
BH MD/PA/NP OP Progress Note Virtual Visit via Video Note  I connected with Marcus Sherman on 10/22/22 at  9:30 AM EDT by a video enabled telemedicine application and verified that I am speaking with the correct person using two identifiers.  Location: Patient: Home Provider: Clinic   I discussed the limitations of evaluation and management by telemedicine and the availability of in person appointments. The patient expressed understanding and agreed to proceed.  I provided 30 minutes of non-face-to-face time during this encounter.   10/22/2022 9:46 AM Marcus Sherman  MRN:  [id number]  Chief Complaint: "I stopped the hydroxyzine"  HPI: 37 year old-year-old male seen today for follow-up psychiatric evaluation.  He has a psychiatric history of tobacco dependence (in remission), anxiety, and depression.  Currently he is managed on Wellbutrin SR 150 mg daily and hydroxyzine 10 mg 3 times daily.  He reports discontinued hydroxyzine as he found it ineffective.  He motes that Wellbutrin is somewhat effective in managing his psychiatric condition.    Today he was well-groomed, pleasant, cooperative, and engaged in conversation.  He informed Clinical research associate that he discontinued hydroxyzine.  He notes that he attempted to take it and higher doses but continued to find an effective.  Patient notes that he restarted Lexapro however does not find it beneficial.  In higher doses he reports that he had side effects.  Provider discussed switching antidepressants or trialing BuSpar, gabapentin, or propranolol as he notes that his anxiety continues to be problematic.  Patient notes that he always feels on edge has been clenching his teeth/jaw and walks with his shoulders raised.   Today provider conducted GAD-7 and patient scored a 10, at his last visit he scored a 9.  Provider also conducted PHQ-9 and patient scored a 5, at his last visit he scored a 3.  He endorses adequate sleep and appetite.  Today he denies SI/HI/VAH,  mania, paranoia.  Patient informed Clinical research associate that he will be traveling to the beach with his girlfriend.  He notes that he is excited about this weekend.  At this time patient does not wish to restart Lexapro or hydroxyzine.  He is agreeable to taking BuSpar 10 mg 3 times daily to help as anxiety depression. Potential side effects of medication and risks vs benefits of treatment vs non-treatment were explained and discussed. All questions were answered.He will continue Wellbutrin as prescribed.  No other concerns noted at this time.    Visit Diagnosis:    ICD-10-CM   1. Mild episode of recurrent major depressive disorder (HCC)  F33.0 busPIRone (BUSPAR) 10 MG tablet    buPROPion (WELLBUTRIN SR) 150 MG 12 hr tablet       Past Psychiatric History: Tobacco use (in remission), anxiety and depression  Past Medical History:  Past Medical History:  Diagnosis Date   Anxiety     Past Surgical History:  Procedure Laterality Date   NO PAST SURGERIES     ORCHIOPEXY Bilateral 01/26/2021   Procedure: RIGHT INGUINAL ORCHIOPEXY ADULT BILATERAL VASECTOMY;  Surgeon: Crista Elliot, MD;  Location: [facility];  Service: Urology;  Laterality: Bilateral;    Family Psychiatric History: Mom depression, maternal grandfather bipolar, sister bipolar   Family History: History reviewed. No pertinent family history.  Social History:  Social History   Socioeconomic History   Marital status: Divorced    Spouse name: Not on file   Number of children: Not on file   Years of education: Not on file   Highest education level: Not on  file  Occupational History   Not on file  Tobacco Use   Smoking status: Some Days    Types: Cigarettes   Smokeless tobacco: Never   Tobacco comments:    Smokes a few times a week, ususally with friends, planning on stopping, resources provided  Vaping Use   Vaping status: Never Used  Substance and Sexual Activity   Alcohol use: Yes    Alcohol/week: 3.0 - 4.0 standard drinks  of alcohol    Types: 3 - 4 Cans of beer per week   Drug use: Yes    Frequency: 7.0 times per week    Types: Marijuana   Sexual activity: Not on file  Other Topics Concern   Not on file  Social History Narrative   Not on file   Social Determinants of Health   Financial Resource Strain: Not on file  Food Insecurity: Not on file  Transportation Needs: Not on file  Physical Activity: Not on file  Stress: Not on file  Social Connections: Not on file    Allergies: No Known Allergies  Metabolic Disorder Labs: No results found for: "HGBA1C", "MPG" No results found for: "PROLACTIN" No results found for: "CHOL", "TRIG", "HDL", "CHOLHDL", "VLDL", "LDLCALC" No results found for: "TSH"  Therapeutic Level Labs: No results found for: "LITHIUM" No results found for: "VALPROATE" No results found for: "CBMZ"  Current Medications: Current Outpatient Medications  Medication Sig Dispense Refill   busPIRone (BUSPAR) 10 MG tablet Take 1 tablet (10 mg total) by mouth 3 (three) times daily. 90 tablet 3   buPROPion (WELLBUTRIN SR) 150 MG 12 hr tablet Take 1 tablet (150 mg total) by mouth daily. 30 tablet 3   HYDROcodone-acetaminophen (NORCO) 5-325 MG tablet Take 1 tablet by mouth every 4 (four) hours as needed for moderate pain. 12 tablet 0   No current facility-administered medications for this visit.     Musculoskeletal: Strength & Muscle Tone: within normal limits and telehealth visit Gait & Station: normal, telehealth visit Patient leans: N/A  Psychiatric Specialty Exam: Review of Systems  There were no vitals taken for this visit.There is no height or weight on file to calculate BMI.  General Appearance: Well Groomed  Eye Contact:  Good  Speech:  Clear and Coherent and Normal Rate  Volume:  Normal  Mood:  Anxious  Affect:  Appropriate and Congruent  Thought Process:  Coherent, Goal Directed, and Linear  Orientation:  Full (Time, Place, and Person)  Thought Content: WDL and  Logical   Suicidal Thoughts:  No  Homicidal Thoughts:  No  Memory:  Immediate;   Good Recent;   Good Remote;   Good  Judgement:  Good  Insight:  Good  Psychomotor Activity:  Normal  Concentration:  Concentration: Good and Attention Span: Good  Recall:  Good  Fund of Knowledge: Good  Language: Good  Akathisia:  No  Handed:  Right  AIMS (if indicated): not done  Assets:  Communication Skills Desire for Improvement Financial Resources/Insurance Housing Intimacy Physical Health Resilience Social Support Talents/Skills  ADL's:  Intact  Cognition: WNL  Sleep:  Good   Screenings: GAD-7    Flowsheet Row Video Visit from 10/22/2022 in [facility] Video Visit from 07/30/2022 in [facility] Video Visit from 05/28/2022 in [facility] Counselor from 04/16/2021 in [facility] Counselor from 03/02/2021 in [facility]  Total GAD-7 Score 10 9 6 3  7  [id number]    Flowsheet Row Video Visit from 10/22/2022 in [facility] Video Visit from 07/30/2022 in [facility] Video Visit from 05/28/2022 in [facility] Counselor from 04/16/2021 in [facility] Counselor from 03/02/2021 in [facility]  PHQ-2 Total Score 3 2 2  0 1  PHQ-9 Total Score 5 3 5 4 5       Flowsheet Row Pre-Admission Testing 60 from 01/19/2021 in [facility] Counselor from 11/20/2020 in [facility] Video Visit from 04/21/2020 in [facility]  C-SSRS RISK CATEGORY No Risk Low Risk No Risk        Assessment and Plan: Today patient endorses mild anxiety and depression.  At this time patient does not wish to restart Lexapro or hydroxyzine.  He is agreeable to taking  BuSpar 10 mg 3 times daily to help as anxiety depression. He will continue Wellbutrin as prescribed.  1. Mild episode of recurrent major depressive disorder (HCC)  Start- busPIRone (BUSPAR) 10 MG tablet; Take 1 tablet (10 mg total) by mouth 3 (three) times daily.  Dispense: 90 tablet; Refill: 3 Continue- buPROPion (WELLBUTRIN SR) 150 MG 12 hr tablet; Take 1 tablet (150 mg total) by mouth daily.  Dispense: 30 tablet; Refill: 3  Collaboration of Care: Collaboration of Care: Other provider involved in patient's care AEB PCP  Patient/Guardian was advised Release of Information must be obtained prior to any record release in order to collaborate their care with an outside provider. Patient/Guardian was advised if they have not already done so to contact the registration department to sign all necessary forms in order for [place] to release information regarding their care.   Consent: Patient/Guardian gives verbal consent for treatment and assignment of benefits for services provided during this visit. Patient/Guardian expressed understanding and agreed to proceed.   Follow-up in 2.5 months Shanna Cisco, NP 10/22/2022, 9:46 AM

## 2022-12-22 ENCOUNTER — Other Ambulatory Visit (HOSPITAL_COMMUNITY): Payer: Self-pay | Admitting: Psychiatry

## 2022-12-22 DIAGNOSIS — F330 Major depressive disorder, recurrent, mild: Secondary | ICD-10-CM

## 2023-01-07 ENCOUNTER — Encounter (HOSPITAL_COMMUNITY): Payer: Self-pay | Admitting: Psychiatry

## 2023-01-07 ENCOUNTER — Telehealth (INDEPENDENT_AMBULATORY_CARE_PROVIDER_SITE_OTHER): Payer: 59 | Admitting: Psychiatry

## 2023-01-07 DIAGNOSIS — F330 Major depressive disorder, recurrent, mild: Secondary | ICD-10-CM

## 2023-01-07 MED ORDER — BUPROPION HCL ER (SR) 150 MG PO TB12: 150.0000 mg | ORAL_TABLET | Freq: Every day | ORAL | 3 refills | Status: DC

## 2023-01-07 MED ORDER — BUSPIRONE HCL 10 MG PO TABS: 10.0000 mg | ORAL_TABLET | Freq: Three times a day (TID) | ORAL | 3 refills | Status: DC

## 2023-01-07 NOTE — Progress Notes (Signed)
BH MD/PA/NP OP Progress Note Virtual Visit via Video Note  I connected with Marcus Sherman on 01/07/23 at  9:00 AM EST by a video enabled telemedicine application and verified that I am speaking with the correct person using two identifiers.  Location: Patient: Home Provider: Clinic   I discussed the limitations of evaluation and management by telemedicine and the availability of in person appointments. The patient expressed understanding and agreed to proceed.  I provided 30 minutes of non-face-to-face time during this encounter.   01/07/2023 9:04 AM Marcus Sherman  MRN:  [id number]  Chief Complaint: "I still clench my jaws"  HPI: 37 year old-year-old male seen today for follow-up psychiatric evaluation.  He has a psychiatric history of tobacco dependence (in remission), anxiety, and depression.  Currently he is managed on Wellbutrin SR 150 mg daily and Buspar 10 mg 3 times daily.  He reports that his medications are effective in managing his psychiatric condition.    Today he was well-groomed, pleasant, cooperative, and engaged in conversation.  He informed Clinical research associate that he continues to clench his jaws and feel on edge.  He however notes that he believes he has some anxiety because he is only taking BuSpar twice daily.  He informed Clinical research associate that he would be more conscious about taking it 3 times daily to help manage his anxiety.  Patient notes that the recent election outcome was stressful but reports that he is coping with this.  He notes that things at work are going well and in his relationship with his girlfriend is in a good place.  Overall he notes his mood is stable and notes that he has minimal anxiety and depression.  Today provider conducted GAD-7 and patient scored a 11, at his last visit he scored a 10.  Provider also conducted PHQ-9 and patient scored a 6, at his last visit he scored a 5.  He endorses adequate sleep and appetite.  Today he denies SI/HI/VAH, mania, paranoia.  No  medication changes made today.  Patient agreeable to continue medication as prescribed.  No other concerns noted at this time.    Visit Diagnosis:    ICD-10-CM   1. Mild episode of recurrent major depressive disorder (HCC)  F33.0 buPROPion (WELLBUTRIN SR) 150 MG 12 hr tablet    busPIRone (BUSPAR) 10 MG tablet        Past Psychiatric History: Tobacco use (in remission), anxiety and depression  Past Medical History:  Past Medical History:  Diagnosis Date   Anxiety     Past Surgical History:  Procedure Laterality Date   NO PAST SURGERIES     ORCHIOPEXY Bilateral 01/26/2021   Procedure: RIGHT INGUINAL ORCHIOPEXY ADULT BILATERAL VASECTOMY;  Surgeon: Crista Elliot, MD;  Location: [facility];  Service: Urology;  Laterality: Bilateral;    Family Psychiatric History: Mom depression, maternal grandfather bipolar, sister bipolar   Family History: History reviewed. No pertinent family history.  Social History:  Social History   Socioeconomic History   Marital status: Divorced    Spouse name: Not on file   Number of children: Not on file   Years of education: Not on file   Highest education level: Not on file  Occupational History   Not on file  Tobacco Use   Smoking status: Some Days    Types: Cigarettes   Smokeless tobacco: Never   Tobacco comments:    Smokes a few times a week, ususally with friends, planning on stopping, resources provided  Vaping Use   Vaping  status: Never Used  Substance and Sexual Activity   Alcohol use: Yes    Alcohol/week: 3.0 - 4.0 standard drinks of alcohol    Types: 3 - 4 Cans of beer per week   Drug use: Yes    Frequency: 7.0 times per week    Types: Marijuana   Sexual activity: Not on file  Other Topics Concern   Not on file  Social History Narrative   Not on file   Social Determinants of Health   Financial Resource Strain: Not on file  Food Insecurity: Not on file  Transportation Needs: Not on file  Physical Activity: Not on file   Stress: Not on file  Social Connections: Not on file    Allergies: No Known Allergies  Metabolic Disorder Labs: No results found for: "HGBA1C", "MPG" No results found for: "PROLACTIN" No results found for: "CHOL", "TRIG", "HDL", "CHOLHDL", "VLDL", "LDLCALC" No results found for: "TSH"  Therapeutic Level Labs: No results found for: "LITHIUM" No results found for: "VALPROATE" No results found for: "CBMZ"  Current Medications: Current Outpatient Medications  Medication Sig Dispense Refill   buPROPion (WELLBUTRIN SR) 150 MG 12 hr tablet Take 1 tablet (150 mg total) by mouth daily. 30 tablet 3   busPIRone (BUSPAR) 10 MG tablet Take 1 tablet (10 mg total) by mouth 3 (three) times daily. 90 tablet 3   HYDROcodone-acetaminophen (NORCO) 5-325 MG tablet Take 1 tablet by mouth every 4 (four) hours as needed for moderate pain. 12 tablet 0   No current facility-administered medications for this visit.     Musculoskeletal: Strength & Muscle Tone: within normal limits and telehealth visit Gait & Station: normal, telehealth visit Patient leans: N/A  Psychiatric Specialty Exam: Review of Systems  There were no vitals taken for this visit.There is no height or weight on file to calculate BMI.  General Appearance: Well Groomed  Eye Contact:  Good  Speech:  Clear and Coherent and Normal Rate  Volume:  Normal  Mood:  Anxious  Affect:  Appropriate and Congruent  Thought Process:  Coherent, Goal Directed, and Linear  Orientation:  Full (Time, Place, and Person)  Thought Content: WDL and Logical   Suicidal Thoughts:  No  Homicidal Thoughts:  No  Memory:  Immediate;   Good Recent;   Good Remote;   Good  Judgement:  Good  Insight:  Good  Psychomotor Activity:  Normal  Concentration:  Concentration: Good and Attention Span: Good  Recall:  Good  Fund of Knowledge: Good  Language: Good  Akathisia:  No  Handed:  Right  AIMS (if indicated): not done  Assets:  Communication  Skills Desire for Improvement Financial Resources/Insurance Housing Intimacy Physical Health Resilience Social Support Talents/Skills  ADL's:  Intact  Cognition: WNL  Sleep:  Good   Screenings: GAD-7    Flowsheet Row Video Visit from 01/07/2023 in [facility] Video Visit from 10/22/2022 in [facility] Video Visit from 07/30/2022 in [facility] Video Visit from 05/28/2022 in [facility] Counselor from 04/16/2021 in [facility]  Total GAD-7 Score 11 10 9 6 3       PHQ2-9    Flowsheet Row Video Visit from 01/07/2023 in [facility] Video Visit from 10/22/2022 in [facility] Video Visit from 07/30/2022 in [facility] Video Visit from 05/28/2022 in [facility] Counselor from 04/16/2021 in [place]  [facility]  PHQ-2 Total Score 4 3 2 2  0  PHQ-9 Total Score 6 5 3 5 4       Flowsheet Row Video Visit from 01/07/2023 in [facility] Pre-Admission Testing 60 from 01/19/2021 in [facility] Counselor from 11/20/2020 in [facility]  C-SSRS RISK CATEGORY Error: Q7 should not be populated when Q6 is No No Risk Low Risk        Assessment and Plan: Today patient reports that he has some anxiety but reports that he is able to cope with it.  He notes that he will be more conscious about taking BuSpar 3 times daily.No medication changes made today.  Patient agreeable to continue medication as prescribed.  No other concerns noted at this time.    1. Mild episode of recurrent major depressive disorder (HCC)  Continue- busPIRone (BUSPAR) 10 MG tablet; Take 1 tablet (10 mg total) by mouth 3 (three) times daily.  Dispense: 90 tablet; Refill:  3 Continue- buPROPion (WELLBUTRIN SR) 150 MG 12 hr tablet; Take 1 tablet (150 mg total) by mouth daily.  Dispense: 30 tablet; Refill: 3  Collaboration of Care: Collaboration of Care: Other provider involved in patient's care AEB PCP  Patient/Guardian was advised Release of Information must be obtained prior to any record release in order to collaborate their care with an outside provider. Patient/Guardian was advised if they have not already done so to contact the registration department to sign all necessary forms in order for [place] to release information regarding their care.   Consent: Patient/Guardian gives verbal consent for treatment and assignment of benefits for services provided during this visit. Patient/Guardian expressed understanding and agreed to proceed.   Follow-up in 2.5 months Shanna Cisco, NP 01/07/2023, 9:04 AM

## 2023-03-18 ENCOUNTER — Encounter (HOSPITAL_COMMUNITY): Payer: Self-pay | Admitting: Psychiatry

## 2023-03-18 ENCOUNTER — Telehealth (INDEPENDENT_AMBULATORY_CARE_PROVIDER_SITE_OTHER): Payer: Commercial Managed Care - HMO | Admitting: Psychiatry

## 2023-03-18 DIAGNOSIS — F330 Major depressive disorder, recurrent, mild: Secondary | ICD-10-CM

## 2023-03-18 MED ORDER — BUPROPION HCL ER (SR) 150 MG PO TB12: 150.0000 mg | ORAL_TABLET | Freq: Every day | ORAL | 3 refills | Status: AC

## 2023-03-18 MED ORDER — BUSPIRONE HCL 10 MG PO TABS: 10.0000 mg | ORAL_TABLET | Freq: Three times a day (TID) | ORAL | 3 refills | Status: AC

## 2023-03-18 NOTE — Progress Notes (Signed)
BH MD/PA/NP OP Progress Note Virtual Visit via Video Note  I connected with Marcus Sherman on 03/18/23 at  9:00 AM EST by a video enabled telemedicine application and verified that I am speaking with the correct person using two identifiers.  Location: Patient: Home Provider: Clinic   I discussed the limitations of evaluation and management by telemedicine and the availability of in person appointments. The patient expressed understanding and agreed to proceed.  I provided 30 minutes of non-face-to-face time during this encounter.   03/18/2023 9:08 AM Marcus Sherman  MRN:  [id number]  Chief Complaint: "I'm okay"  HPI: 38 year old-year-old male seen today for follow-up psychiatric evaluation.  He has a psychiatric history of tobacco dependence (in remission), anxiety, and depression.  Currently he is managed on Wellbutrin SR 150 mg daily and Buspar 10 mg 3 times daily.  He reports that his medications are effective in managing his psychiatric condition.    Today he was well-groomed, pleasant, cooperative, and engaged in conversation.  He informed Clinical research associate that he is okay but daily deals with mild anxiety.  Throughout his day he reports that he continues to clench his jaws.  He notes that since his last visit he has been taking BuSpar more often.  He notes that they have been somewhat effective in managing his anxiety.  Provider conducted a GAD-7 and patient scored an 8, at his last visit he scored an 11.  Provider also conducted PHQ-9 and patient scored a 5, at his last visit he scored a 6. He endorses adequate sleep and appetite.  Today he denies SI/HI/VAH, mania, paranoia.  No medication changes made today.  Patient agreeable to continue medication as prescribed.  Provider discussed increasing BuSpar to 15 mg 3 times a day, starting a low-dose of propranolol, or low-dose of gabapentin in the future if his anxiety does not improve.  Provider encouraged patient to research medication.  He endorsed  understanding and agreed.  No other concerns noted at this time.    Visit Diagnosis:    ICD-10-CM   1. Mild episode of recurrent major depressive disorder (HCC)  F33.0 buPROPion (WELLBUTRIN SR) 150 MG 12 hr tablet    busPIRone (BUSPAR) 10 MG tablet         Past Psychiatric History: Tobacco use (in remission), anxiety and depression  Past Medical History:  Past Medical History:  Diagnosis Date   Anxiety     Past Surgical History:  Procedure Laterality Date   NO PAST SURGERIES     ORCHIOPEXY Bilateral 01/26/2021   Procedure: RIGHT INGUINAL ORCHIOPEXY ADULT BILATERAL VASECTOMY;  Surgeon: Crista Elliot, MD;  Location: [facility];  Service: Urology;  Laterality: Bilateral;    Family Psychiatric History: Mom depression, maternal grandfather bipolar, sister bipolar   Family History: History reviewed. No pertinent family history.  Social History:  Social History   Socioeconomic History   Marital status: Divorced    Spouse name: Not on file   Number of children: Not on file   Years of education: Not on file   Highest education level: Not on file  Occupational History   Not on file  Tobacco Use   Smoking status: Some Days    Types: Cigarettes   Smokeless tobacco: Never   Tobacco comments:    Smokes a few times a week, ususally with friends, planning on stopping, resources provided  Vaping Use   Vaping status: Never Used  Substance and Sexual Activity   Alcohol use: Yes    Alcohol/week:  3.0 - 4.0 standard drinks of alcohol    Types: 3 - 4 Cans of beer per week   Drug use: Yes    Frequency: 7.0 times per week    Types: Marijuana   Sexual activity: Not on file  Other Topics Concern   Not on file  Social History Narrative   Not on file   Social Drivers of Health   Financial Resource Strain: Not on file  Food Insecurity: Not on file  Transportation Needs: Not on file  Physical Activity: Not on file  Stress: Not on file  Social Connections: Not on file     Allergies: No Known Allergies  Metabolic Disorder Labs: No results found for: "HGBA1C", "MPG" No results found for: "PROLACTIN" No results found for: "CHOL", "TRIG", "HDL", "CHOLHDL", "VLDL", "LDLCALC" No results found for: "TSH"  Therapeutic Level Labs: No results found for: "LITHIUM" No results found for: "VALPROATE" No results found for: "CBMZ"  Current Medications: Current Outpatient Medications  Medication Sig Dispense Refill   buPROPion (WELLBUTRIN SR) 150 MG 12 hr tablet Take 1 tablet (150 mg total) by mouth daily. 30 tablet 3   busPIRone (BUSPAR) 10 MG tablet Take 1 tablet (10 mg total) by mouth 3 (three) times daily. 90 tablet 3   HYDROcodone-acetaminophen (NORCO) 5-325 MG tablet Take 1 tablet by mouth every 4 (four) hours as needed for moderate pain. 12 tablet 0   No current facility-administered medications for this visit.     Musculoskeletal: Strength & Muscle Tone: within normal limits and telehealth visit Gait & Station: normal, telehealth visit Patient leans: N/A  Psychiatric Specialty Exam: Review of Systems  There were no vitals taken for this visit.There is no height or weight on file to calculate BMI.  General Appearance: Well Groomed  Eye Contact:  Good  Speech:  Clear and Coherent and Normal Rate  Volume:  Normal  Mood:  Anxious and mild  Affect:  Appropriate and Congruent  Thought Process:  Coherent, Goal Directed, and Linear  Orientation:  Full (Time, Place, and Person)  Thought Content: WDL and Logical   Suicidal Thoughts:  No  Homicidal Thoughts:  No  Memory:  Immediate;   Good Recent;   Good Remote;   Good  Judgement:  Good  Insight:  Good  Psychomotor Activity:  Normal  Concentration:  Concentration: Good and Attention Span: Good  Recall:  Good  Fund of Knowledge: Good  Language: Good  Akathisia:  No  Handed:  Right  AIMS (if indicated): not done  Assets:  Communication Skills Desire for Improvement Financial  Resources/Insurance Housing Intimacy Physical Health Resilience Social Support Talents/Skills  ADL's:  Intact  Cognition: WNL  Sleep:  Good   Screenings: GAD-7    Flowsheet Row Video Visit from 03/18/2023 in [facility] Video Visit from 01/07/2023 in [facility] Video Visit from 10/22/2022 in [facility] Video Visit from 07/30/2022 in [facility] Video Visit from 05/28/2022 in [facility]  Total GAD-7 Score 8 11 10 9 6       PHQ2-9    Flowsheet Row Video Visit from 03/18/2023 in [facility] Video Visit from 01/07/2023 in [facility] Video Visit from 10/22/2022 in [facility] Video Visit from 07/30/2022 in [facility] Video Visit from 05/28/2022 in [facility]  PHQ-2 Total Score 2 4 3 2  2  PHQ-9 Total Score 5 6 5 3 5       Flowsheet Row Video Visit from 01/07/2023 in [facility] Pre-Admission Testing 60 from 01/19/2021 in [facility] Counselor from 11/20/2020 in [facility]  C-SSRS RISK CATEGORY Error: Q7 should not be populated when Q6 is No No Risk Low Risk        Assessment and Plan: Today patient reports that he has milf anxiety daily. No medication changes made today.  Patient agreeable to continue medication as prescribed.  Provider discussed increasing BuSpar to 15 mg 3 times a day, starting a low-dose of propranolol, or low-dose of gabapentin in the future if his anxiety does not improve.  Provider encouraged patient to research medication.  He endorsed understanding and agreed.   1. Mild episode of recurrent major depressive disorder (HCC)  Continue- busPIRone (BUSPAR) 10 MG tablet; Take 1 tablet (10 mg  total) by mouth 3 (three) times daily.  Dispense: 90 tablet; Refill: 3 Continue- buPROPion (WELLBUTRIN SR) 150 MG 12 hr tablet; Take 1 tablet (150 mg total) by mouth daily.  Dispense: 30 tablet; Refill: 3  Collaboration of Care: Collaboration of Care: Other provider involved in patient's care AEB PCP  Patient/Guardian was advised Release of Information must be obtained prior to any record release in order to collaborate their care with an outside provider. Patient/Guardian was advised if they have not already done so to contact the registration department to sign all necessary forms in order for [place] to release information regarding their care.   Consent: Patient/Guardian gives verbal consent for treatment and assignment of benefits for services provided during this visit. Patient/Guardian expressed understanding and agreed to proceed.   Follow-up in 2.5 months Shanna Cisco, NP 03/18/2023, 9:08 AM

## 2023-06-03 ENCOUNTER — Telehealth (HOSPITAL_COMMUNITY): Payer: Commercial Managed Care - HMO | Admitting: Psychiatry
